# Patient Record
Sex: Female | Born: 1980 | Race: Black or African American | Hispanic: No | Marital: Single | Smoking: Never smoker
Health system: Southern US, Community
[De-identification: ages and names within clinical notes are randomized; demographics above are authoritative.]

## PROBLEM LIST (undated history)

## (undated) DIAGNOSIS — J302 Other seasonal allergic rhinitis: Secondary | ICD-10-CM

## (undated) DIAGNOSIS — F419 Anxiety disorder, unspecified: Secondary | ICD-10-CM

## (undated) DIAGNOSIS — J45909 Unspecified asthma, uncomplicated: Secondary | ICD-10-CM

## (undated) DIAGNOSIS — K219 Gastro-esophageal reflux disease without esophagitis: Secondary | ICD-10-CM

## (undated) DIAGNOSIS — I10 Essential (primary) hypertension: Secondary | ICD-10-CM

## (undated) DIAGNOSIS — S3992XA Unspecified injury of lower back, initial encounter: Secondary | ICD-10-CM

## (undated) DIAGNOSIS — D649 Anemia, unspecified: Secondary | ICD-10-CM

## (undated) DIAGNOSIS — S8990XA Unspecified injury of unspecified lower leg, initial encounter: Secondary | ICD-10-CM

## (undated) DIAGNOSIS — R7303 Prediabetes: Secondary | ICD-10-CM

## (undated) DIAGNOSIS — R519 Headache, unspecified: Secondary | ICD-10-CM

## (undated) DIAGNOSIS — E66813 Obesity, class 3: Secondary | ICD-10-CM

## (undated) DIAGNOSIS — J189 Pneumonia, unspecified organism: Secondary | ICD-10-CM

## (undated) DIAGNOSIS — S46009A Unspecified injury of muscle(s) and tendon(s) of the rotator cuff of unspecified shoulder, initial encounter: Secondary | ICD-10-CM

## (undated) DIAGNOSIS — F32A Depression, unspecified: Secondary | ICD-10-CM

## (undated) HISTORY — DX: Obesity, class 3: E66.813

## (undated) HISTORY — DX: Other seasonal allergic rhinitis: J30.2

## (undated) HISTORY — DX: Unspecified injury of lower back, initial encounter: S39.92XA

## (undated) HISTORY — PX: TONSILLECTOMY: SUR1361

## (undated) HISTORY — DX: Morbid (severe) obesity due to excess calories: E66.01

## (undated) HISTORY — DX: Unspecified injury of muscle(s) and tendon(s) of the rotator cuff of unspecified shoulder, initial encounter: S46.009A

## (undated) HISTORY — DX: Essential (primary) hypertension: I10

## (undated) HISTORY — DX: Unspecified asthma, uncomplicated: J45.909

## (undated) HISTORY — DX: Unspecified injury of unspecified lower leg, initial encounter: S89.90XA

---

## 2002-01-06 ENCOUNTER — Encounter: Admission: RE | Admit: 2002-01-06 | Discharge: 2002-01-06 | Payer: Self-pay | Admitting: Emergency Medicine

## 2002-01-06 ENCOUNTER — Encounter: Payer: Self-pay | Admitting: Emergency Medicine

## 2004-01-06 ENCOUNTER — Encounter: Admission: RE | Admit: 2004-01-06 | Discharge: 2004-01-06 | Payer: Self-pay | Admitting: Emergency Medicine

## 2005-04-16 IMAGING — CR DG CHEST 2V
2 series · 2 of 2 positions shown · non-contrast
Comparison: none

CLINICAL DATA: Cough, wheezing, asthma.
 TWO VIEW CHEST ? 01/06/04 
 The heart is borderline in size with left ventricular configuration.  There are no infiltrative or edematous changes.  There are mildly accentuated bronchial markings. 
 IMPRESSION
 Borderline cardiac size.  Mildly accentuated bronchial markings.  No acute infiltrate.

[view not recorded (1 of 2)]
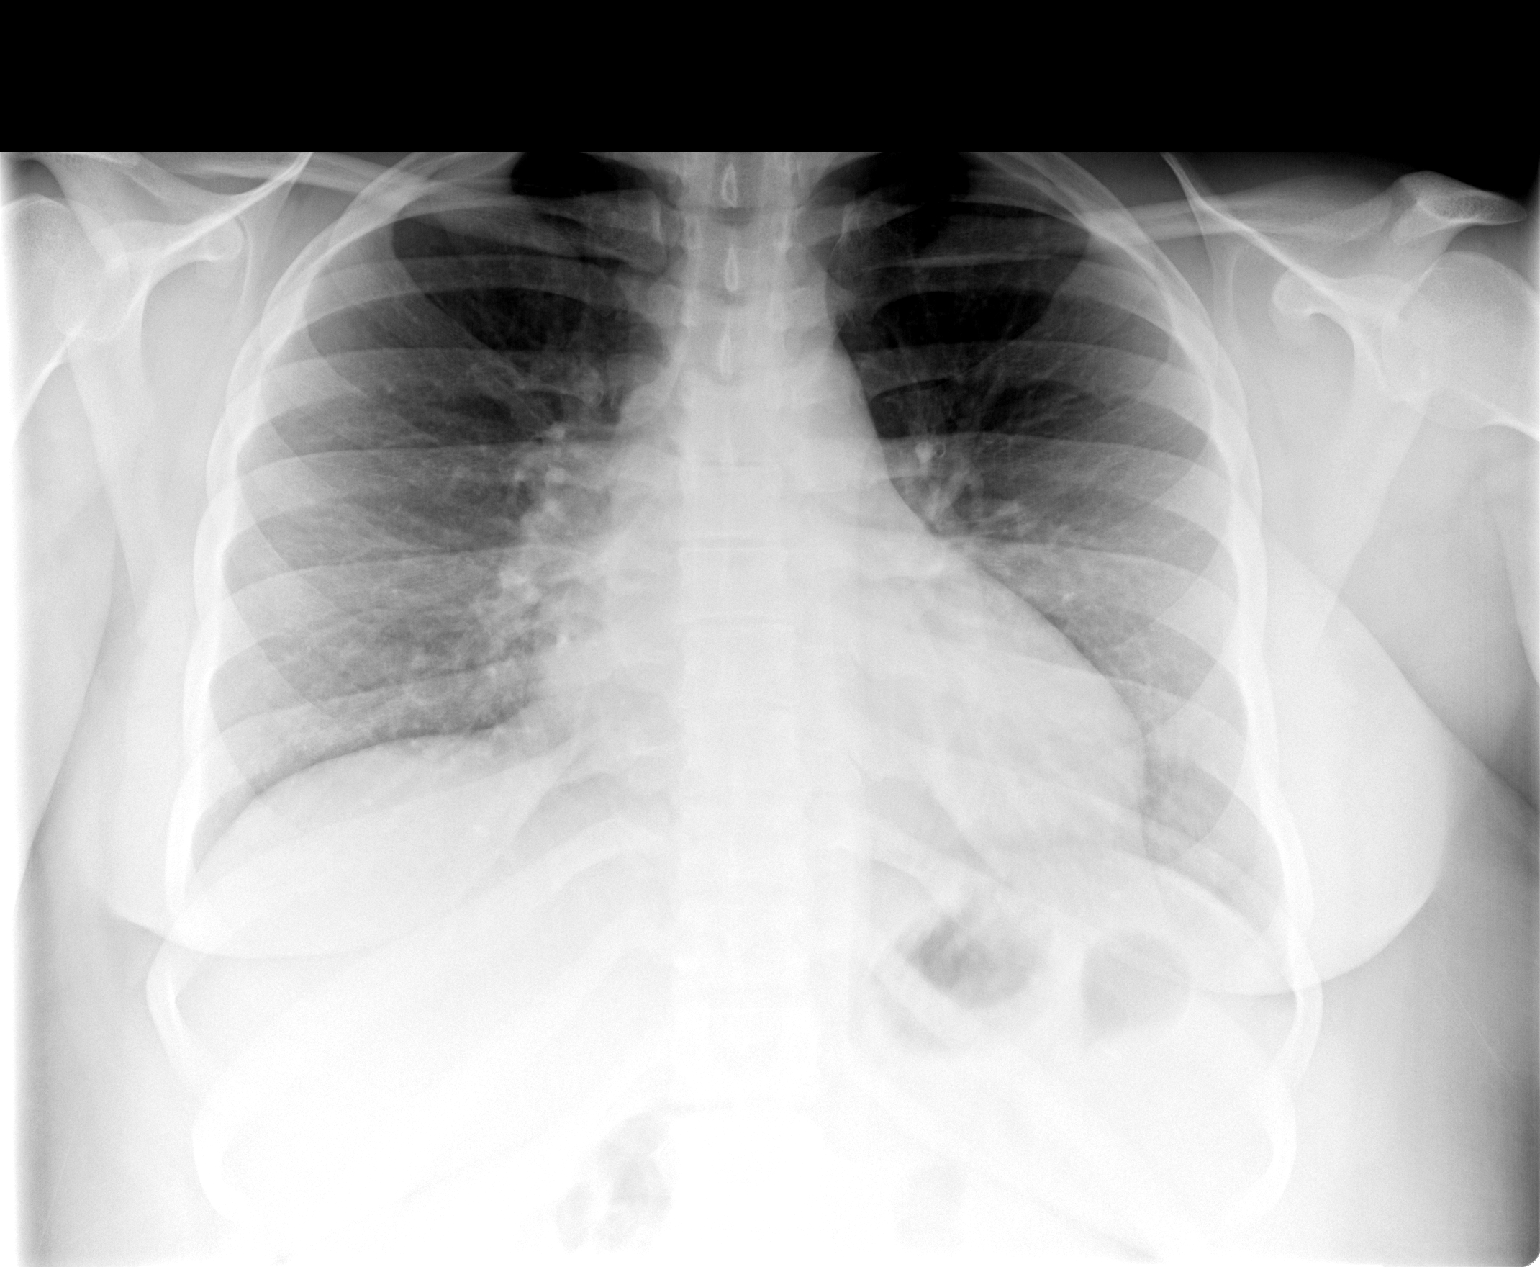

[view not recorded (2 of 2)]
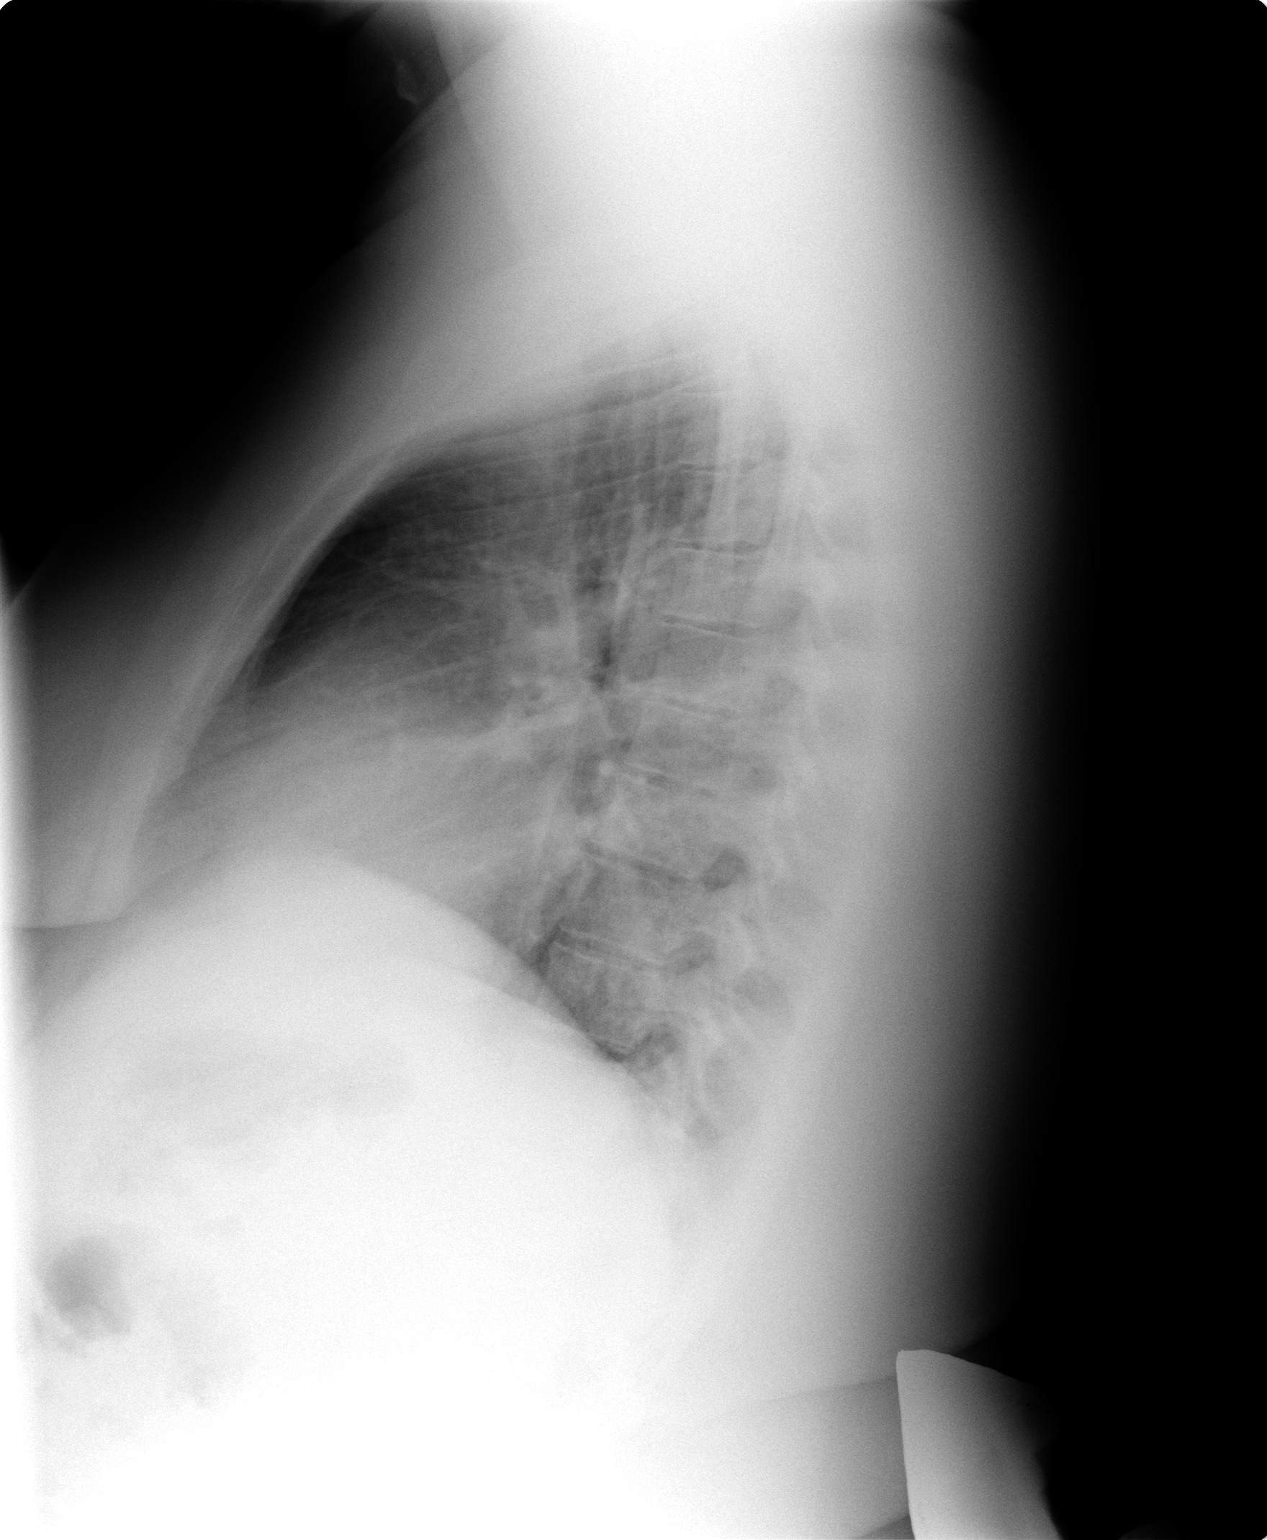

[2 of 2 positions shown; findings below may reference images not displayed]

## 2008-01-27 ENCOUNTER — Emergency Department (HOSPITAL_COMMUNITY): Admission: EM | Admit: 2008-01-27 | Discharge: 2008-01-27 | Payer: Self-pay | Admitting: Family Medicine

## 2014-09-04 NOTE — L&D Delivery Note (Cosign Needed)
Delivery Note Progressed to + 3 station with laboring down.  Pushed one time to delivery.  At 11:39 PM a viable and healthy female was delivered via Vaginal, Spontaneous Delivery (Presentation: ; Occiput Anterior).  APGAR: 9, 9; weight  .   Placenta status: Intact, Spontaneous.  Cord:  with the following complications: None.   Anesthesia: Epidural  Episiotomy: None Lacerations:  2nd degree midline perineal Suture Repair: 3.0 Monocryl Est. Blood Loss (mL):    Mom to postpartum.  Baby to Couplet care / Skin to Skin.  [facility]ILL[facility],Amy Fults 01/20/2015, 12:02 AM

## 2014-10-23 ENCOUNTER — Ambulatory Visit: Payer: Medicaid Other

## 2014-10-29 ENCOUNTER — Encounter: Payer: Self-pay | Admitting: Internal Medicine

## 2014-10-29 ENCOUNTER — Encounter: Payer: Self-pay | Admitting: Obstetrics & Gynecology

## 2014-10-29 ENCOUNTER — Ambulatory Visit (INDEPENDENT_AMBULATORY_CARE_PROVIDER_SITE_OTHER): Payer: Self-pay | Admitting: Internal Medicine

## 2014-10-29 VITALS — BP 157/102 | HR 100 | Temp 98.1°F | Ht 62.0 in | Wt 271.3 lb

## 2014-10-29 DIAGNOSIS — N926 Irregular menstruation, unspecified: Secondary | ICD-10-CM

## 2014-10-29 DIAGNOSIS — J302 Other seasonal allergic rhinitis: Secondary | ICD-10-CM

## 2014-10-29 DIAGNOSIS — I10 Essential (primary) hypertension: Secondary | ICD-10-CM | POA: Insufficient documentation

## 2014-10-29 DIAGNOSIS — Z0000 Encounter for general adult medical examination without abnormal findings: Secondary | ICD-10-CM

## 2014-10-29 DIAGNOSIS — R1032 Left lower quadrant pain: Secondary | ICD-10-CM | POA: Insufficient documentation

## 2014-10-29 DIAGNOSIS — J4520 Mild intermittent asthma, uncomplicated: Secondary | ICD-10-CM

## 2014-10-29 DIAGNOSIS — J45909 Unspecified asthma, uncomplicated: Secondary | ICD-10-CM | POA: Insufficient documentation

## 2014-10-29 LAB — GLUCOSE, CAPILLARY: Glucose-Capillary: 107 mg/dL — ABNORMAL HIGH (ref 70–99)

## 2014-10-29 LAB — POCT GLYCOSYLATED HEMOGLOBIN (HGB A1C): Hemoglobin A1C: 5.5

## 2014-10-29 MED ORDER — ALBUTEROL SULFATE (2.5 MG/3ML) 0.083% IN NEBU: 2.5000 mg | INHALATION_SOLUTION | Freq: Four times a day (QID) | RESPIRATORY_TRACT | Status: DC | PRN

## 2014-10-29 MED ORDER — HYDROCHLOROTHIAZIDE 25 MG PO TABS: 25.0000 mg | ORAL_TABLET | Freq: Every day | ORAL | Status: DC

## 2014-10-29 MED ORDER — ALBUTEROL SULFATE HFA 108 (90 BASE) MCG/ACT IN AERS: 2.0000 | INHALATION_SPRAY | Freq: Four times a day (QID) | RESPIRATORY_TRACT | Status: DC | PRN

## 2014-10-29 MED ORDER — CETIRIZINE HCL 10 MG PO TABS: 10.0000 mg | ORAL_TABLET | Freq: Every day | ORAL | Status: DC

## 2014-10-29 NOTE — Patient Instructions (Addendum)
-  It was a pleasure meeting you! -Start taking Zyrtec every day for your allergies.  -Use the albuterol inhaler or nebulizer as needed. Carry the inhaler with you at all times.  -Stop taking Afrin.This will raise your blood pressure.  -Start taking Hydrochlorothiazide 25mg  daily for your blood pressure.  -You have been referred to Gynecology. They will call you with the appointment date and time.  -Follow up with us in 1 month.

## 2014-10-30 ENCOUNTER — Encounter: Payer: Self-pay | Admitting: Internal Medicine

## 2014-10-30 DIAGNOSIS — Z0000 Encounter for general adult medical examination without abnormal findings: Secondary | ICD-10-CM | POA: Insufficient documentation

## 2014-10-30 DIAGNOSIS — N926 Irregular menstruation, unspecified: Secondary | ICD-10-CM | POA: Insufficient documentation

## 2014-10-30 NOTE — Progress Notes (Signed)
   Subjective:    Patient ID: Amy SimsJuliet L Ferguson, female    DOB: 01-09-1981, 34 y.o.   MRN: [id number]  HPI Ms. Amy Ferguson is a 34 yr old woman with PMH of asthma, morbid obesity, seasonal allergies, HTN, presenting for routine visit. She had not been to a doctor in over one year and had been out of her BP medication (HCTZ) for 1 yr. She reports a recent cold with Afrin use daily and Coricidin for the past 3 weeks. She used to take Zyrtec but stopped taking this medication. She still has a nebulizer machine at home and uses albuterol neb as needed, with no recent use.  She complains of hirsutism for years with metromenorrhagia, menstrual cycles light some months than heavy other months. Her LMP was on 10/01/14.  She has had LLQ pain that comes and goes, not associated with her periods or with movements.  She has been constipated off and on but reports adequate water and fiber intake (fruits and vegetables).   Review of Systems  Constitutional: Negative for fever, chills, diaphoresis, activity change, appetite change, fatigue and unexpected weight change.  HENT: Positive for congestion. Negative for ear pain, nosebleeds, postnasal drip, rhinorrhea, sinus pressure, sore throat and voice change.   Eyes: Negative for pain.  Respiratory: Negative for cough, shortness of breath and wheezing.   Cardiovascular: Negative for chest pain, palpitations and leg swelling.  Gastrointestinal: Positive for abdominal pain and constipation. Negative for nausea, vomiting, diarrhea and blood in stool.  Endocrine: Negative for cold intolerance, heat intolerance, polydipsia and polyuria.  Genitourinary: Positive for menstrual problem. Negative for dysuria, frequency, vaginal discharge and difficulty urinating.  Musculoskeletal: Negative for back pain and arthralgias.  Skin: Negative for color change and pallor.       Hirsutism   Neurological: Negative for dizziness, light-headedness and headaches.    Psychiatric/Behavioral: Negative for agitation.       Objective:   Physical Exam  Constitutional: She is oriented to person, place, and time. She appears well-developed and well-nourished. No distress.  Morbidly obese with central obesity  HENT:  Head: Normocephalic.  Mouth/Throat: Oropharynx is clear and moist.  Pale nasal turbinates bilaterally   Eyes: Conjunctivae are normal. No scleral icterus.  Neck: No thyromegaly present.  Acanthosis nigricans with skin tags on posterior and lateral neck   Cardiovascular: Normal rate and regular rhythm.   Pulmonary/Chest: Effort normal and breath sounds normal. No respiratory distress. She has no wheezes. She has no rales.  Abdominal: Soft. Bowel sounds are normal. She exhibits no distension and no mass. There is tenderness. There is no rebound and no guarding.  Obese. Has tenderness to deep palpation of low LLQ with no rebound tenderness.   Musculoskeletal: She exhibits no edema or tenderness.  Neurological: She is alert and oriented to person, place, and time. Coordination normal.  Skin: Skin is warm and dry. She is not diaphoretic. No erythema.  Hirsutism with thick dark hair around chin  Psychiatric: She has a normal mood and affect.  Nursing note and vitals reviewed.         Assessment & Plan:

## 2014-10-30 NOTE — Assessment & Plan Note (Signed)
Could be 2/2 PCOS if she has this Dx.  Will discuss referral to dietitian during her next visit.

## 2014-10-30 NOTE — Assessment & Plan Note (Signed)
No recent flare up.  -Rx albuterol inh, encouraged pt to carry this at all times -Rx albuterol neb for home use

## 2014-10-30 NOTE — Assessment & Plan Note (Signed)
BP Readings from Last 3 Encounters:  10/29/14 157/102    No results found for: NA, K, CREATININE  Assessment: Blood pressure control:  Not controlled Progress toward BP goal:   not at goal Comments: She used to be on HCTZ 25mg  one yr ago per her report. States tat she was on lisinopril long time ago.   Plan: Medications:  start HCTZ 25mg  daily Educational resources provided:   Self management tools provided:   Other plans: Follow up in 1 month. Will obtain BMET at that time. Pt signed release of info so we can obtain records from previous PCP ([place]

## 2014-10-30 NOTE — Assessment & Plan Note (Signed)
Pt advised to avoid Afrin.  Resume Zyrtec daily.  Saline nasal spray PRN

## 2014-10-30 NOTE — Assessment & Plan Note (Addendum)
She had HIV screening within 2 yrs. Wears protection.  Not sexually active recently.  Had abnormal PAP in the past but most recent reported as normal. Will have PAP smear at GYN Decline flu vaccine Had Tdap within 10 yrs-->will obtain records from PCP Screened for DM2--HgA1c 5.5%, CBG 107, she does not have diabetes

## 2014-10-30 NOTE — Assessment & Plan Note (Signed)
She has irregular menstrual cycles, central obesity with weight loss difficulty, hirsutism. She may have PCOS.  -referred to GYN--she is also due for PAP smear.

## 2014-10-30 NOTE — Assessment & Plan Note (Signed)
Unclear etiology. The pain comes and goes, localized to her low LLQ. Constipation v ovarian cyst possible. No melena or hematochezia reported.  Will continue monitoring for now.  Pt encouraged to increase water intake to improve constipation. Continue intake of fresh fruits and vegetables.

## 2014-11-03 NOTE — Progress Notes (Signed)
Internal Medicine Clinic Attending  Case discussed with Dr. Kennerly soon after the resident saw the patient.  We reviewed the resident's history and exam and pertinent patient test results.  I agree with the assessment, diagnosis, and plan of care documented in the resident's note.  

## 2014-11-26 ENCOUNTER — Ambulatory Visit: Payer: Medicaid Other | Admitting: Internal Medicine

## 2014-12-10 ENCOUNTER — Ambulatory Visit (INDEPENDENT_AMBULATORY_CARE_PROVIDER_SITE_OTHER): Payer: Self-pay | Admitting: Obstetrics & Gynecology

## 2014-12-10 ENCOUNTER — Encounter: Payer: Self-pay | Admitting: Obstetrics & Gynecology

## 2014-12-10 VITALS — BP 166/95 | HR 103 | Temp 98.1°F | Wt 265.0 lb

## 2014-12-10 DIAGNOSIS — R102 Pelvic and perineal pain: Secondary | ICD-10-CM

## 2014-12-10 DIAGNOSIS — N915 Oligomenorrhea, unspecified: Secondary | ICD-10-CM

## 2014-12-10 LAB — TSH: TSH: 2.649 u[IU]/mL (ref 0.350–4.500)

## 2014-12-10 NOTE — Progress Notes (Signed)
US scheduled for May 9th @ [phone]

## 2014-12-10 NOTE — Progress Notes (Signed)
   Subjective:    Patient ID: Amy Ferguson, female    DOB: 1980-09-28, 34 y.o.   MRN: [id number]  HPI 33yo SAA G0 is here today with the complaint of daily pelvic pain since Sept. She has been taking IBU 800mg  with only a mild relief. She has not been sexually active sice 8/15. She also has oligomenorhea and hirsuitism. Her last period was  1/16. She has to pluck hair from her face. Also has hair on her check   Review of Systems She will get insurance in 3 weeks as of 01-03-15. Her periods were normal in middle and high school, menarche at [age]. But she put on weight in college and her periods became rare starting back then. When she was on the OCP, her periods were regular    Objective:   Physical Exam Morbidly obese WNBFNAD Breathing and ambulating normally       Assessment & Plan:  HTN- She will follow up with [facility] next week I will get her an appt with a nutritionist Probable PCOS- rec weight loss, no OCPs now due to her HTN, check TSH Pelvic pain- schedule u/s

## 2015-01-11 ENCOUNTER — Other Ambulatory Visit: Payer: Self-pay | Admitting: Family Medicine

## 2015-01-11 ENCOUNTER — Ambulatory Visit (HOSPITAL_COMMUNITY)
Admission: RE | Admit: 2015-01-11 | Discharge: 2015-01-11 | Disposition: A | Discharge status: Home / Self Care | Payer: No Typology Code available for payment source | Source: Ambulatory Visit | Attending: Obstetrics & Gynecology | Admitting: Obstetrics & Gynecology

## 2015-01-11 ENCOUNTER — Telehealth: Payer: Self-pay

## 2015-01-11 DIAGNOSIS — Z3490 Encounter for supervision of normal pregnancy, unspecified, unspecified trimester: Secondary | ICD-10-CM | POA: Diagnosis present

## 2015-01-11 DIAGNOSIS — R102 Pelvic and perineal pain: Secondary | ICD-10-CM

## 2015-01-11 NOTE — Telephone Encounter (Signed)
[facility] states patient scheduled for pelvic U/S-- patient pregnant-- needs order for U/S in system. U/S order placed.

## 2015-01-18 ENCOUNTER — Ambulatory Visit (INDEPENDENT_AMBULATORY_CARE_PROVIDER_SITE_OTHER): Payer: PRIVATE HEALTH INSURANCE | Admitting: Obstetrics & Gynecology

## 2015-01-18 ENCOUNTER — Inpatient Hospital Stay (HOSPITAL_COMMUNITY)
Admission: AD | Admit: 2015-01-18 | Discharge: 2015-01-22 | DRG: 774 | Disposition: A | Discharge status: Home / Self Care | Payer: Medicaid Other | Source: Ambulatory Visit | Attending: Family Medicine | Admitting: Family Medicine

## 2015-01-18 ENCOUNTER — Encounter: Payer: Self-pay | Admitting: Obstetrics & Gynecology

## 2015-01-18 ENCOUNTER — Encounter (HOSPITAL_COMMUNITY): Payer: Self-pay | Admitting: *Deleted

## 2015-01-18 DIAGNOSIS — O1413 Severe pre-eclampsia, third trimester: Principal | ICD-10-CM | POA: Diagnosis present

## 2015-01-18 DIAGNOSIS — O1092 Unspecified pre-existing hypertension complicating childbirth: Secondary | ICD-10-CM | POA: Diagnosis present

## 2015-01-18 DIAGNOSIS — A599 Trichomoniasis, unspecified: Secondary | ICD-10-CM | POA: Diagnosis present

## 2015-01-18 DIAGNOSIS — O99214 Obesity complicating childbirth: Secondary | ICD-10-CM | POA: Diagnosis present

## 2015-01-18 DIAGNOSIS — Z3A40 40 weeks gestation of pregnancy: Secondary | ICD-10-CM | POA: Diagnosis present

## 2015-01-18 DIAGNOSIS — O480 Post-term pregnancy: Secondary | ICD-10-CM | POA: Diagnosis present

## 2015-01-18 DIAGNOSIS — J45909 Unspecified asthma, uncomplicated: Secondary | ICD-10-CM | POA: Diagnosis present

## 2015-01-18 DIAGNOSIS — Z91040 Latex allergy status: Secondary | ICD-10-CM | POA: Diagnosis not present

## 2015-01-18 DIAGNOSIS — O10912 Unspecified pre-existing hypertension complicating pregnancy, second trimester: Secondary | ICD-10-CM

## 2015-01-18 DIAGNOSIS — O9832 Other infections with a predominantly sexual mode of transmission complicating childbirth: Secondary | ICD-10-CM | POA: Diagnosis present

## 2015-01-18 DIAGNOSIS — Z79899 Other long term (current) drug therapy: Secondary | ICD-10-CM

## 2015-01-18 DIAGNOSIS — Z6841 Body Mass Index (BMI) 40.0 and over, adult: Secondary | ICD-10-CM

## 2015-01-18 DIAGNOSIS — O10919 Unspecified pre-existing hypertension complicating pregnancy, unspecified trimester: Secondary | ICD-10-CM

## 2015-01-18 DIAGNOSIS — O0933 Supervision of pregnancy with insufficient antenatal care, third trimester: Secondary | ICD-10-CM | POA: Diagnosis not present

## 2015-01-18 DIAGNOSIS — O99213 Obesity complicating pregnancy, third trimester: Secondary | ICD-10-CM

## 2015-01-18 DIAGNOSIS — O9952 Diseases of the respiratory system complicating childbirth: Secondary | ICD-10-CM | POA: Diagnosis present

## 2015-01-18 DIAGNOSIS — O99824 Streptococcus B carrier state complicating childbirth: Secondary | ICD-10-CM | POA: Diagnosis not present

## 2015-01-18 LAB — POCT URINALYSIS DIP (DEVICE)
Glucose, UA: NEGATIVE mg/dL
Nitrite: NEGATIVE
PH: 6.5 (ref 5.0–8.0)
PROTEIN: 100 mg/dL — AB
UROBILINOGEN UA: 0.2 mg/dL (ref 0.0–1.0)

## 2015-01-18 LAB — URINE MICROSCOPIC-ADD ON

## 2015-01-18 LAB — COMPREHENSIVE METABOLIC PANEL
ALK PHOS: 126 U/L (ref 38–126)
ALT: 10 U/L — AB (ref 14–54)
AST: 21 U/L (ref 15–41)
Albumin: 3.2 g/dL — ABNORMAL LOW (ref 3.5–5.0)
Anion gap: 9 (ref 5–15)
BUN: 6 mg/dL (ref 6–20)
CALCIUM: 9.1 mg/dL (ref 8.9–10.3)
CO2: 22 mmol/L (ref 22–32)
CREATININE: 0.53 mg/dL (ref 0.44–1.00)
Chloride: 104 mmol/L (ref 101–111)
GFR calc Af Amer: >60 mL/min (ref >60)
GFR calc non Af Amer: >60 mL/min (ref >60)
Glucose, Bld: 68 mg/dL (ref 65–99)
POTASSIUM: 4.6 mmol/L (ref 3.5–5.1)
Sodium: 135 mmol/L (ref 135–145)
TOTAL PROTEIN: 6.7 g/dL (ref 6.5–8.1)
Total Bilirubin: 0.3 mg/dL (ref 0.3–1.2)

## 2015-01-18 LAB — RAPID HIV SCREEN (HIV 1/2 AB+AG)
HIV 1/2 ANTIBODIES: NONREACTIVE
HIV-1 P24 Antigen - HIV24: NONREACTIVE

## 2015-01-18 LAB — GROUP B STREP BY PCR: GROUP B STREP BY PCR: POSITIVE — AB

## 2015-01-18 LAB — CBC
HEMATOCRIT: 31.7 % — AB (ref 36.0–46.0)
Hemoglobin: 10.1 g/dL — ABNORMAL LOW (ref 12.0–15.0)
MCH: 24.9 pg — AB (ref 26.0–34.0)
MCHC: 31.9 g/dL (ref 30.0–36.0)
MCV: 78.3 fL (ref 78.0–100.0)
Platelets: 331 10*3/uL (ref 150–400)
RBC: 4.05 MIL/uL (ref 3.87–5.11)
RDW: 16.5 % — ABNORMAL HIGH (ref 11.5–15.5)
WBC: 5.4 10*3/uL (ref 4.0–10.5)

## 2015-01-18 LAB — URINALYSIS, ROUTINE W REFLEX MICROSCOPIC
Glucose, UA: NEGATIVE mg/dL
Ketones, ur: 15 mg/dL — AB
NITRITE: NEGATIVE
PH: 6.5 (ref 5.0–8.0)
Protein, ur: 30 mg/dL — AB
UROBILINOGEN UA: 0.2 mg/dL (ref 0.0–1.0)

## 2015-01-18 LAB — OB RESULTS CONSOLE GBS: GBS: POSITIVE

## 2015-01-18 LAB — OB RESULTS CONSOLE GC/CHLAMYDIA: Gonorrhea: NEGATIVE

## 2015-01-18 LAB — PROTEIN / CREATININE RATIO, URINE
Creatinine, Urine: 442 mg/dL
PROTEIN CREATININE RATIO: 0.08 mg/mg{creat} (ref 0.00–0.15)
Total Protein, Urine: 34 mg/dL

## 2015-01-18 LAB — OB RESULTS CONSOLE HIV ANTIBODY (ROUTINE TESTING): HIV: NONREACTIVE

## 2015-01-18 MED ORDER — OXYCODONE-ACETAMINOPHEN 5-325 MG PO TABS: 1.0000 | ORAL_TABLET | ORAL | Status: DC | PRN

## 2015-01-18 MED ORDER — METRONIDAZOLE 500 MG PO TABS
2000.0000 mg | ORAL_TABLET | Freq: Once | ORAL | Status: AC
  Administered 2015-01-18: 2000 mg via ORAL
  Filled 2015-01-18: qty 4

## 2015-01-18 MED ORDER — LACTATED RINGERS IV SOLN
500.0000 mL | INTRAVENOUS | Status: DC | PRN
  Administered 2015-01-19: 500 mL via INTRAVENOUS

## 2015-01-18 MED ORDER — HYDRALAZINE HCL 20 MG/ML IJ SOLN
5.0000 mg | INTRAMUSCULAR | Status: AC | PRN
Start: 2015-01-18 — End: 2015-01-18
  Administered 2015-01-18: 10 mg via INTRAVENOUS
  Administered 2015-01-18: 5 mg via INTRAVENOUS
  Filled 2015-01-18 (×2): qty 1

## 2015-01-18 MED ORDER — PENICILLIN G POTASSIUM 5000000 UNITS IJ SOLR
2.5000 10*6.[IU] | INTRAVENOUS | Status: DC
  Administered 2015-01-19 (×3): 2.5 10*6.[IU] via INTRAVENOUS
  Filled 2015-01-18 (×8): qty 2.5

## 2015-01-18 MED ORDER — OXYTOCIN 40 UNITS IN LACTATED RINGERS INFUSION - SIMPLE MED: 62.5000 mL/h | INTRAVENOUS | Status: DC

## 2015-01-18 MED ORDER — CITRIC ACID-SODIUM CITRATE 334-500 MG/5ML PO SOLN
30.0000 mL | ORAL | Status: DC | PRN
  Administered 2015-01-19: 30 mL via ORAL
  Filled 2015-01-18: qty 15
  Filled 2015-01-18: qty 30

## 2015-01-18 MED ORDER — OXYCODONE-ACETAMINOPHEN 5-325 MG PO TABS: 2.0000 | ORAL_TABLET | ORAL | Status: DC | PRN

## 2015-01-18 MED ORDER — OXYTOCIN 40 UNITS IN LACTATED RINGERS INFUSION - SIMPLE MED
1.0000 m[IU]/min | INTRAVENOUS | Status: DC
  Administered 2015-01-18: 2 m[IU]/min via INTRAVENOUS
  Filled 2015-01-18: qty 1000

## 2015-01-18 MED ORDER — LACTATED RINGERS IV SOLN
INTRAVENOUS | Status: DC
  Administered 2015-01-18 – 2015-01-19 (×6): via INTRAVENOUS

## 2015-01-18 MED ORDER — ONDANSETRON HCL 4 MG/2ML IJ SOLN: 4.0000 mg | Freq: Four times a day (QID) | INTRAMUSCULAR | Status: DC | PRN

## 2015-01-18 MED ORDER — PENICILLIN G POTASSIUM 5000000 UNITS IJ SOLR
5.0000 10*6.[IU] | Freq: Once | INTRAVENOUS | Status: AC
  Administered 2015-01-19: 5 10*6.[IU] via INTRAVENOUS
  Filled 2015-01-18: qty 5

## 2015-01-18 MED ORDER — PENICILLIN G POTASSIUM 5000000 UNITS IJ SOLR
2.5000 10*6.[IU] | INTRAVENOUS | Status: DC
  Filled 2015-01-18 (×2): qty 2.5

## 2015-01-18 MED ORDER — DEXTROSE 5 % IV SOLN
5.0000 10*6.[IU] | Freq: Once | INTRAVENOUS | Status: DC
  Filled 2015-01-18: qty 5

## 2015-01-18 MED ORDER — MAGNESIUM SULFATE 4 GM/100ML IV SOLN
4.0000 g | Freq: Once | INTRAVENOUS | Status: AC
  Filled 2015-01-18: qty 100

## 2015-01-18 MED ORDER — MISOPROSTOL 25 MCG QUARTER TABLET
25.0000 ug | ORAL_TABLET | ORAL | Status: DC | PRN
  Administered 2015-01-19 (×2): 25 ug via VAGINAL
  Filled 2015-01-18 (×2): qty 0.25

## 2015-01-18 MED ORDER — ACETAMINOPHEN 325 MG PO TABS: 650.0000 mg | ORAL_TABLET | ORAL | Status: DC | PRN

## 2015-01-18 MED ORDER — LABETALOL HCL 200 MG PO TABS
200.0000 mg | ORAL_TABLET | Freq: Three times a day (TID) | ORAL | Status: DC
  Administered 2015-01-18 – 2015-01-19 (×3): 200 mg via ORAL
  Filled 2015-01-18 (×2): qty 2
  Filled 2015-01-18: qty 1
  Filled 2015-01-18 (×3): qty 2

## 2015-01-18 MED ORDER — MAGNESIUM SULFATE 50 % IJ SOLN
2.0000 g/h | INTRAVENOUS | Status: DC
  Administered 2015-01-18: 4 g/h via INTRAVENOUS
  Administered 2015-01-19: 2 g/h via INTRAVENOUS
  Filled 2015-01-18 (×2): qty 80

## 2015-01-18 MED ORDER — FENTANYL CITRATE (PF) 100 MCG/2ML IJ SOLN
50.0000 ug | INTRAMUSCULAR | Status: DC | PRN
  Administered 2015-01-18 – 2015-01-19 (×5): 100 ug via INTRAVENOUS
  Filled 2015-01-18 (×5): qty 2

## 2015-01-18 MED ORDER — LABETALOL HCL 5 MG/ML IV SOLN
20.0000 mg | INTRAVENOUS | Status: DC | PRN
  Administered 2015-01-18: 20 mg via INTRAVENOUS
  Filled 2015-01-18: qty 4

## 2015-01-18 MED ORDER — TERBUTALINE SULFATE 1 MG/ML IJ SOLN: 0.2500 mg | Freq: Once | INTRAMUSCULAR | Status: AC | PRN

## 2015-01-18 MED ORDER — LIDOCAINE HCL (PF) 1 % IJ SOLN
30.0000 mL | INTRAMUSCULAR | Status: DC | PRN
  Filled 2015-01-18: qty 30

## 2015-01-18 MED ORDER — FLEET ENEMA 7-19 GM/118ML RE ENEM: 1.0000 | ENEMA | RECTAL | Status: DC | PRN

## 2015-01-18 MED ORDER — OXYTOCIN BOLUS FROM INFUSION
500.0000 mL | INTRAVENOUS | Status: DC
  Administered 2015-01-19: 500 mL via INTRAVENOUS

## 2015-01-18 NOTE — Progress Notes (Addendum)
Patient ID: Amy Ferguson, female   DOB: 04-04-1981, 34 y.o.   MRN: [id number] Doing well, comfortable  Filed Vitals:   01/18/15 2202 01/18/15 2240 01/18/15 2302 01/18/15 2332  BP: 117/63 154/92 152/91 128/77  Pulse: 80 87 87 81  Temp: 98.4 F (36.9 C)     TempSrc:      Resp: 19 20 20 20   Height:      Weight:      SpO2:       FHR reassuring, nonreactive right now, no decels  UCs every 3-5 min  Dilation: Closed Effacement (%): Thick Cervical Position: Posterior Station: -3 Presentation: Vertex Exam by:: roberts  I think we should turn off the Pitocin and insert Cytotec, due to unfavorable cervix.

## 2015-01-18 NOTE — Progress Notes (Signed)
GBS positive result telephoned to Michaela CornerJ Phelps DO

## 2015-01-18 NOTE — Progress Notes (Signed)
Labor Progress Note  S: Doing well. Starting to feel uncomfortable with contractions and wants IV pain medications. Also states she will most likely want epidural. Denies HA, scotomata, RUQ pain.   O:  BP 129/69 mmHg  Pulse 82  Temp(Src) 97.9 F (36.6 C) (Oral)  Resp 16  Ht 5\' 3"  (1.6 m)  Wt 258 lb (117.028 kg)  BMI 45.71 kg/m2  SpO2 98%  LMP 10/01/2014 Cervix: deferred, foley bulb in place Toco: Ctx q 3-4 mins  FHT: 120 bpm, +accels, mod variability Heart tones appropriate +DTRs   Intake/Output Summary (Last 24 hours) at 01/18/15 1824 Last data filed at 01/18/15 1800  Gross per 24 hour  Intake 710.76 ml  Output    700 ml  Net  10.76 ml   A&P: 34 y.o. G1P0 4375w2d in early labor. #FHT reassuring #Pain: order placed for IV pain medication; patient may get epidural upon request #Decels: None #augmentaion: Continue pitocin and foley bulb. #Severe Preeclampsia - stable, no signs of mag toxicity, BPs <135/70, continue mag  Expectant mangement  Amy AdaJazma Leanthony Rhett, DO 01/18/2015, 6:15 PM PGY-1, [facility]

## 2015-01-18 NOTE — Progress Notes (Signed)
Here for first visit. Just found out is pregnant 01/11/15. Given 28 wk education pamphlets. States bp med was changed to hctz/ lisinopril recently but stopped taking it when she found out she was pregnant- so none for 1 week. States has been having headaches so she thought her blood pressure was high.
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
## 2015-01-18 NOTE — Progress Notes (Signed)
Elevated BP term pregnancy scheduled for NOB, will send to MAU for admission

## 2015-01-18 NOTE — Patient Instructions (Signed)
Labor Induction  Labor induction is when steps are taken to cause a pregnant woman to begin the labor process. Most women go into labor on their own between 37 weeks and 42 weeks of the pregnancy. When this does not happen or when there is a medical need, methods may be used to induce labor. Labor induction causes a pregnant woman's uterus to contract. It also causes the cervix to soften (ripen), open (dilate), and thin out (efface). Usually, labor is not induced before 39 weeks of the pregnancy unless there is a problem with the baby or mother.  Before inducing labor, your health care provider will consider a number of factors, including the following:  The medical condition of you and the baby.   How many weeks along you are.   The status of the baby's lung maturity.   The condition of the cervix.   The position of the baby.  WHAT ARE THE REASONS FOR LABOR INDUCTION? Labor may be induced for the following reasons:  The health of the baby or mother is at risk.   The pregnancy is overdue by 1 week or more.   The water breaks but labor does not start on its own.   The mother has a health condition or serious illness, such as high blood pressure, infection, placental abruption, or diabetes.  The amniotic fluid amounts are low around the baby.   The baby is distressed.  Convenience or wanting the baby to be born on a certain date is not a reason for inducing labor. WHAT METHODS ARE USED FOR LABOR INDUCTION? Several methods of labor induction may be used, such as:   Prostaglandin medicine. This medicine causes the cervix to dilate and ripen. The medicine will also start contractions. It can be taken by mouth or by inserting a suppository into the vagina.   Inserting a thin tube (catheter) with a balloon on the end into the vagina to dilate the cervix. Once inserted, the balloon is expanded with water, which causes the cervix to open.   Stripping the membranes. Your health  care provider separates amniotic sac tissue from the cervix, causing the cervix to be stretched and causing the release of a hormone called progesterone. This may cause the uterus to contract. It is often done during an office visit. You will be sent home to wait for the contractions to begin. You will then come in for an induction.   Breaking the water. Your health care provider makes a hole in the amniotic sac using a small instrument. Once the amniotic sac breaks, contractions should begin. This may still take hours to see an effect.   Medicine to trigger or strengthen contractions. This medicine is given through an IV access tube inserted into a vein in your arm.  All of the methods of induction, besides stripping the membranes, will be done in the hospital. Induction is done in the hospital so that you and the baby can be carefully monitored.  HOW LONG DOES IT TAKE FOR LABOR TO BE INDUCED? Some inductions can take up to 2-3 days. Depending on the cervix, it usually takes less time. It takes longer when you are induced early in the pregnancy or if this is your first pregnancy. If a mother is still pregnant and the induction has been going on for 2-3 days, either the mother will be sent home or a cesarean delivery will be needed. WHAT ARE THE RISKS ASSOCIATED WITH LABOR INDUCTION? Some of the risks of induction   include:   Changes in fetal heart rate, such as too high, too low, or erratic.   Fetal distress.   Chance of infection for the mother and baby.   Increased chance of having a cesarean delivery.   Breaking off (abruption) of the placenta from the uterus (rare).   Uterine rupture (very rare).  When induction is needed for medical reasons, the benefits of induction may outweigh the risks. WHAT ARE SOME REASONS FOR NOT INDUCING LABOR? Labor induction should not be done if:   It is shown that your baby does not tolerate labor.   You have had previous surgeries on your  uterus, such as a myomectomy or the removal of fibroids.   Your placenta lies very low in the uterus and blocks the opening of the cervix (placenta previa).   Your baby is not in a head-down position.   The umbilical cord drops down into the birth canal in front of the baby. This could cut off the baby's blood and oxygen supply.   You have had a previous cesarean delivery.   There are unusual circumstances, such as the baby being extremely premature.  Document Released: 01/10/2007 Document Revised: 04/23/2013 Document Reviewed: 03/20/2013 Exit[employer] Patient Information 2015 [employer]. This information is not intended to replace advice given to you by your health care provider. Make sure you discuss any questions you have with your health care provider.  

## 2015-01-18 NOTE — MAU Note (Signed)
Pt was sent over from office for elevated b/p 150 /113. C/o mild headache.

## 2015-01-19 ENCOUNTER — Inpatient Hospital Stay (HOSPITAL_COMMUNITY): Payer: Medicaid Other | Admitting: Anesthesiology

## 2015-01-19 ENCOUNTER — Encounter (HOSPITAL_COMMUNITY): Payer: Self-pay | Admitting: Anesthesiology

## 2015-01-19 LAB — HIV ANTIBODY (ROUTINE TESTING W REFLEX): HIV Screen 4th Generation wRfx: NONREACTIVE

## 2015-01-19 LAB — CBC
HCT: 29.7 % — ABNORMAL LOW (ref 36.0–46.0)
HEMOGLOBIN: 9.6 g/dL — AB (ref 12.0–15.0)
MCH: 24.9 pg — AB (ref 26.0–34.0)
MCHC: 32.3 g/dL (ref 30.0–36.0)
MCV: 76.9 fL — ABNORMAL LOW (ref 78.0–100.0)
Platelets: 292 10*3/uL (ref 150–400)
RBC: 3.86 MIL/uL — ABNORMAL LOW (ref 3.87–5.11)
RDW: 16.7 % — ABNORMAL HIGH (ref 11.5–15.5)
WBC: 8.3 10*3/uL (ref 4.0–10.5)

## 2015-01-19 LAB — RUBELLA SCREEN: Rubella: 2.61 index (ref >0.99)

## 2015-01-19 LAB — GC/CHLAMYDIA PROBE AMP (~~LOC~~) NOT AT ARMC
Chlamydia: NEGATIVE
Neisseria Gonorrhea: NEGATIVE

## 2015-01-19 LAB — RPR: RPR: NONREACTIVE

## 2015-01-19 LAB — HEPATITIS B SURFACE ANTIBODY, QUANTITATIVE: Hepatitis B-Post: 267.7 m[IU]/mL (ref >9.9)

## 2015-01-19 LAB — OB RESULTS CONSOLE HEPATITIS B SURFACE ANTIGEN: HEP B S AG: NEGATIVE

## 2015-01-19 MED ORDER — FENTANYL 2.5 MCG/ML BUPIVACAINE 1/10 % EPIDURAL INFUSION (WH - ANES)
14.0000 mL/h | INTRAMUSCULAR | Status: DC | PRN
  Administered 2015-01-19 (×2): 14 mL/h via EPIDURAL
  Filled 2015-01-19 (×2): qty 125

## 2015-01-19 MED ORDER — EPHEDRINE 5 MG/ML INJ
10.0000 mg | INTRAVENOUS | Status: DC | PRN
  Filled 2015-01-19: qty 2

## 2015-01-19 MED ORDER — PHENYLEPHRINE 40 MCG/ML (10ML) SYRINGE FOR IV PUSH (FOR BLOOD PRESSURE SUPPORT)
80.0000 ug | PREFILLED_SYRINGE | INTRAVENOUS | Status: DC | PRN
  Filled 2015-01-19: qty 2
  Filled 2015-01-19: qty 20

## 2015-01-19 MED ORDER — DIPHENHYDRAMINE HCL 50 MG/ML IJ SOLN: 12.5000 mg | INTRAMUSCULAR | Status: DC | PRN

## 2015-01-19 MED ORDER — OXYTOCIN 40 UNITS IN LACTATED RINGERS INFUSION - SIMPLE MED
1.0000 m[IU]/min | INTRAVENOUS | Status: DC
  Administered 2015-01-19: 2 m[IU]/min via INTRAVENOUS
  Filled 2015-01-19: qty 1000

## 2015-01-19 MED ORDER — LIDOCAINE HCL (PF) 1 % IJ SOLN
INTRAMUSCULAR | Status: DC | PRN
  Administered 2015-01-19 (×2): 8 mL

## 2015-01-19 MED ORDER — MISOPROSTOL 50MCG HALF TABLET
50.0000 ug | ORAL_TABLET | ORAL | Status: DC | PRN
  Administered 2015-01-19 (×2): 50 ug via ORAL
  Filled 2015-01-19 (×3): qty 1

## 2015-01-19 NOTE — Anesthesia Procedure Notes (Signed)
Epidural Patient location during procedure: OB Start time: 01/19/2015 1:46 PM End time: 01/19/2015 1:50 PM  Staffing Anesthesiologist: Leilani AbleHATCHETT, Camika Marsico Performed by: anesthesiologist   Preanesthetic Checklist Completed: patient identified, surgical consent, pre-op evaluation, timeout performed, IV checked, risks and benefits discussed and monitors and equipment checked  Epidural Patient position: sitting Prep: site prepped and draped and DuraPrep Patient monitoring: continuous pulse ox and blood pressure Approach: midline Location: L3-L4 Injection technique: LOR air  Needle:  Needle type: Tuohy  Needle gauge: 17 G Needle length: 9 cm and 9 Needle insertion depth: 6 cm Catheter type: closed end flexible Catheter size: 19 Gauge Catheter at skin depth: 11 cm Test dose: negative and Other  Assessment Sensory level: T9 Events: blood not aspirated, injection not painful, no injection resistance, negative IV test and no paresthesia  Additional Notes Reason for block:procedure for pain

## 2015-01-19 NOTE — Progress Notes (Signed)
Patient ID: Rolley SimsJuliet L Ferguson, female   DOB: Jan 28, 1981, 34 y.o.   MRN: [id number] Doing well  Filed Vitals:   01/19/15 1928 01/19/15 1931 01/19/15 2002 01/19/15 2031  BP:  136/87 134/75   Pulse:  86 88   Temp:    98 F (36.7 C)  TempSrc:    Oral  Resp: 20   20  Height:      Weight:      SpO2:       FHR currently nonreactive UCs every 3-6 min  Continue Pitocin

## 2015-01-19 NOTE — Progress Notes (Signed)
Labor Progress Note  Rolley SimsJuliet L Filippi is a 34 y.o. G1P0 at 5220w3d  admitted for induction of labor due to preeclampsia.  S: Doing well. Getting epidural upon entering room. States her contractions are stronger and closer together. Denies PIH symptoms (HA, scotomata, RUQ pain).  O:  BP 133/83 mmHg  Pulse 82  Temp(Src) 98.2 F (36.8 C) (Oral)  Resp 16  Ht 5\' 3"  (1.6 m)  Wt 258 lb (117.028 kg)  BMI 45.71 kg/m2  SpO2 100%  LMP 10/01/2014  Total I/O In: 1835 [P.O.:360; I.V.:1125; IV Piggyback:350] Out: 975 [Urine:975]  FHT:  FHR: 130 bpm, variability: minimal ,  accelerations:  Abscent,  decelerations:  Absent UC:   None, hard to pick up SVE:   Dilation: 2.5 Effacement (%): Thick Station: -3 Exam by:: h stone rnc SROM: clear  Dilation: 2.5 Effacement (%): Thick Cervical Position: Posterior Station: -3 Presentation: Vertex Exam by:: h stone rnc  Labs: Lab Results  Component Value Date   WBC 8.3 01/19/2015   HGB 9.6* 01/19/2015   HCT 29.7* 01/19/2015   MCV 76.9* 01/19/2015   PLT 292 01/19/2015    Assessment / Plan: 34 y.o. G1P0 6720w3d in early labor Induction of labor due to preeclampsia,  progressing well  Labor: Progressing normally, continue Cytotec and FB Preeclampsia: Continue Mag, no signs of toxicity Fetal Wellbeing:  Category II Pain Control:  Epidural Anticipated MOD:  NSVD  Expectant management   [name], DO 01/19/2015, 2:51 PM PGY-1, [facility]

## 2015-01-19 NOTE — Anesthesia Preprocedure Evaluation (Signed)
Anesthesia Evaluation  Patient identified by MRN, date of birth, ID band Patient awake    Reviewed: Allergy & Precautions, H&P , NPO status , Patient's Chart, lab work & pertinent test results  Airway Mallampati: II  TM Distance: >3 FB Neck ROM: full    Dental no notable dental hx.    Pulmonary neg pulmonary ROS,    Pulmonary exam normal       Cardiovascular hypertension, Pt. on medications Normal cardiovascular exam    Neuro/Psych negative neurological ROS  negative psych ROS   GI/Hepatic negative GI ROS, Neg liver ROS,   Endo/Other  Morbid obesity  Renal/GU negative Renal ROS     Musculoskeletal   Abdominal (+) + obese,   Peds  Hematology negative hematology ROS (+)   Anesthesia Other Findings   Reproductive/Obstetrics (+) Pregnancy                             Anesthesia Physical Anesthesia Plan  ASA: III  Anesthesia Plan: Epidural   Post-op Pain Management:    Induction:   Airway Management Planned:   Additional Equipment:   Intra-op Plan:   Post-operative Plan:   Informed Consent: I have reviewed the patients History and Physical, chart, labs and discussed the procedure including the risks, benefits and alternatives for the proposed anesthesia with the patient or authorized representative who has indicated his/her understanding and acceptance.     Plan Discussed with:   Anesthesia Plan Comments:         Anesthesia Quick Evaluation

## 2015-01-19 NOTE — Progress Notes (Signed)
Patient ID: Amy SimsJuliet L Ferguson, female   DOB: August 11, 1981, 34 y.o.   MRN: [id number] Due for another cytotec, but contracting too much  Filed Vitals:   01/19/15 0102 01/19/15 0202 01/19/15 0301 01/19/15 0402  BP: 113/59 125/72 135/88 113/72  Pulse: 83 78 85 82  Temp:      TempSrc:      Resp: 20 18 17 18   Height:      Weight:      SpO2:        FHR reassuring UCs every 3 min  Dilation: 1.5 Effacement (%): Thick Cervical Position: Posterior Station: -3 Presentation: Vertex Exam by:: Honeycutt, RN  Will monitor for decrease in UCs then insert another Cytotec

## 2015-01-19 NOTE — Progress Notes (Signed)
Patient ID: Rolley SimsJuliet L Ferguson, female   DOB: 1981/05/13, 34 y.o.   MRN: [id number] Feeling some pressure. But overall comfortable  Filed Vitals:   01/19/15 1928 01/19/15 1931 01/19/15 2002 01/19/15 2031  BP:  136/87 134/75   Pulse:  86 88   Temp:    98 F (36.7 C)  TempSrc:    Oral  Resp: 20   20  Height:      Weight:      SpO2:       FHR stable with some variable decels FSE applied  Dilation: 10 Effacement (%): 100 Cervical Position: Anterior Station: 0 Presentation: Vertex Exam by:: Mayford KnifeWilliams CNM  Will labor down for a while then start pushing

## 2015-01-20 ENCOUNTER — Encounter (HOSPITAL_COMMUNITY): Payer: Self-pay | Admitting: *Deleted

## 2015-01-20 DIAGNOSIS — O1413 Severe pre-eclampsia, third trimester: Secondary | ICD-10-CM

## 2015-01-20 DIAGNOSIS — O9832 Other infections with a predominantly sexual mode of transmission complicating childbirth: Secondary | ICD-10-CM

## 2015-01-20 DIAGNOSIS — O99824 Streptococcus B carrier state complicating childbirth: Secondary | ICD-10-CM

## 2015-01-20 DIAGNOSIS — O99214 Obesity complicating childbirth: Secondary | ICD-10-CM

## 2015-01-20 DIAGNOSIS — A5901 Trichomonal vulvovaginitis: Secondary | ICD-10-CM

## 2015-01-20 DIAGNOSIS — Z3A40 40 weeks gestation of pregnancy: Secondary | ICD-10-CM

## 2015-01-20 LAB — CBC
HEMATOCRIT: 25 % — AB (ref 36.0–46.0)
Hemoglobin: 8 g/dL — ABNORMAL LOW (ref 12.0–15.0)
MCH: 24.8 pg — ABNORMAL LOW (ref 26.0–34.0)
MCHC: 32 g/dL (ref 30.0–36.0)
MCV: 77.6 fL — AB (ref 78.0–100.0)
PLATELETS: 290 10*3/uL (ref 150–400)
RBC: 3.22 MIL/uL — AB (ref 3.87–5.11)
RDW: 16.8 % — ABNORMAL HIGH (ref 11.5–15.5)
WBC: 13.3 10*3/uL — ABNORMAL HIGH (ref 4.0–10.5)

## 2015-01-20 LAB — MRSA PCR SCREENING: MRSA BY PCR: NEGATIVE

## 2015-01-20 MED ORDER — PRENATAL MULTIVITAMIN CH
1.0000 | ORAL_TABLET | Freq: Every day | ORAL | Status: DC
  Administered 2015-01-20 – 2015-01-22 (×3): 1 via ORAL
  Filled 2015-01-20 (×3): qty 1

## 2015-01-20 MED ORDER — ZOLPIDEM TARTRATE 5 MG PO TABS: 5.0000 mg | ORAL_TABLET | Freq: Every evening | ORAL | Status: DC | PRN

## 2015-01-20 MED ORDER — OXYCODONE-ACETAMINOPHEN 5-325 MG PO TABS: 1.0000 | ORAL_TABLET | ORAL | Status: DC | PRN

## 2015-01-20 MED ORDER — ACETAMINOPHEN 325 MG PO TABS: 650.0000 mg | ORAL_TABLET | ORAL | Status: DC | PRN

## 2015-01-20 MED ORDER — ONDANSETRON HCL 4 MG PO TABS: 4.0000 mg | ORAL_TABLET | ORAL | Status: DC | PRN

## 2015-01-20 MED ORDER — SENNOSIDES-DOCUSATE SODIUM 8.6-50 MG PO TABS
2.0000 | ORAL_TABLET | ORAL | Status: DC
  Administered 2015-01-20: 2 via ORAL
  Filled 2015-01-20: qty 2

## 2015-01-20 MED ORDER — ONDANSETRON HCL 4 MG/2ML IJ SOLN: 4.0000 mg | INTRAMUSCULAR | Status: DC | PRN

## 2015-01-20 MED ORDER — IBUPROFEN 600 MG PO TABS
600.0000 mg | ORAL_TABLET | Freq: Four times a day (QID) | ORAL | Status: DC
  Administered 2015-01-20 (×3): 600 mg via ORAL
  Filled 2015-01-20 (×5): qty 1

## 2015-01-20 MED ORDER — PNEUMOCOCCAL VAC POLYVALENT 25 MCG/0.5ML IJ INJ
0.5000 mL | INJECTION | INTRAMUSCULAR | Status: DC
  Filled 2015-01-20: qty 0.5

## 2015-01-20 MED ORDER — SIMETHICONE 80 MG PO CHEW: 80.0000 mg | CHEWABLE_TABLET | ORAL | Status: DC | PRN

## 2015-01-20 MED ORDER — MAGNESIUM SULFATE 50 % IJ SOLN
2.0000 g/h | INTRAVENOUS | Status: AC
  Administered 2015-01-20: 2 g/h via INTRAVENOUS
  Filled 2015-01-20: qty 80

## 2015-01-20 MED ORDER — DIBUCAINE 1 % RE OINT: 1.0000 "application " | TOPICAL_OINTMENT | RECTAL | Status: DC | PRN

## 2015-01-20 MED ORDER — LANOLIN HYDROUS EX OINT: TOPICAL_OINTMENT | CUTANEOUS | Status: DC | PRN

## 2015-01-20 MED ORDER — TETANUS-DIPHTH-ACELL PERTUSSIS 5-2.5-18.5 LF-MCG/0.5 IM SUSP
0.5000 mL | Freq: Once | INTRAMUSCULAR | Status: DC
  Filled 2015-01-20: qty 0.5

## 2015-01-20 MED ORDER — WITCH HAZEL-GLYCERIN EX PADS: 1.0000 "application " | MEDICATED_PAD | CUTANEOUS | Status: DC | PRN

## 2015-01-20 MED ORDER — OXYCODONE-ACETAMINOPHEN 5-325 MG PO TABS
2.0000 | ORAL_TABLET | ORAL | Status: DC | PRN
Start: 2015-01-20 — End: 2015-01-22

## 2015-01-20 MED ORDER — DIPHENHYDRAMINE HCL 25 MG PO CAPS: 25.0000 mg | ORAL_CAPSULE | Freq: Four times a day (QID) | ORAL | Status: DC | PRN

## 2015-01-20 MED ORDER — BENZOCAINE-MENTHOL 20-0.5 % EX AERO
1.0000 "application " | INHALATION_SPRAY | CUTANEOUS | Status: DC | PRN
  Administered 2015-01-20: 1 via TOPICAL
  Filled 2015-01-20: qty 56

## 2015-01-20 MED ORDER — RHO D IMMUNE GLOBULIN 1500 UNIT/2ML IJ SOSY
300.0000 ug | PREFILLED_SYRINGE | Freq: Once | INTRAMUSCULAR | Status: AC
  Administered 2015-01-21: 300 ug via INTRAVENOUS
  Filled 2015-01-20: qty 2

## 2015-01-20 MED ORDER — LACTATED RINGERS IV SOLN
INTRAVENOUS | Status: DC
  Administered 2015-01-20 (×2): via INTRAVENOUS

## 2015-01-20 NOTE — Progress Notes (Signed)
Post Partum Day 1 Subjective: no complaints, up ad lib, voiding and tolerating PO  Objective: Blood pressure 120/69, pulse 86, temperature 98.7 F (37.1 C), temperature source Oral, resp. rate 18, height 5\' 3"  (1.6 m), weight 254 lb 1.6 oz (115.259 kg), last menstrual period 10/01/2014, SpO2 97 %, unknown if currently breastfeeding.  Physical Exam:  General: alert, cooperative and no distress Lochia: appropriate Uterine Fundus: firm DVT Evaluation: No evidence of DVT seen on physical exam. Negative Homan's sign.   Recent Labs  01/18/15 1300 01/19/15 1303  HGB 10.1* 9.6*  HCT 31.7* 29.7*    Assessment/Plan: On Mag for preeclampsia x 24 hours.   No other complications currently BP controlled.   LOS: 2 days   Jaedon Siler JEHIEL 01/20/2015, 9:31 AM

## 2015-01-20 NOTE — Lactation Note (Signed)
This note was copied from the chart of Amy Ferguson Mrozek. Lactation Consultation Note Initial visit made with mom in AICU.  Breastfeeding consultation services and support information given and reviewed with patient. This is her first baby.  Baby is now 10 hours old and has been to the breast 4 times.  Mom states he is latching easily.  Instructed on feeding cues and techniques to obtain deep latch.  Encouraged mom to hand express prior to latching baby.  Baby just had bath and he is sleeping skin to skin on mom's chest.  Instructed to call for assist/concerns prn.  Patient Name: Amy Ferguson Madani ZOXWR'UToday's Date: 01/20/2015 Reason for consult: Initial assessment   Maternal Data    Feeding Feeding Type: Breast Milk Length of feed: 0 min  LATCH Score/Interventions                      Lactation Tools Discussed/Used     Consult Status Consult Status: Follow-up Date: 01/21/15 Follow-up type: In-patient    Huston FoleyMOULDEN, Tyion Boylen S 01/20/2015, 9:43 AM

## 2015-01-20 NOTE — Anesthesia Postprocedure Evaluation (Signed)
  Anesthesia Post-op Note  Patient: Amy SimsJuliet L Ferguson  Procedure(s) Performed: * No procedures listed *  Patient Location: PACU and A-ICU  Anesthesia Type:Epidural  Level of Consciousness: awake, alert  and oriented  Airway and Oxygen Therapy: Patient Spontanous Breathing  Post-op Pain: none  Post-op Assessment: Post-op Vital signs reviewed and Patient's Cardiovascular Status Stable  Post-op Vital Signs: Reviewed and stable  Last Vitals:  Filed Vitals:   01/20/15 0800  BP: 114/78  Pulse: 94  Temp:   Resp:     Complications: No apparent anesthesia complications

## 2015-01-21 ENCOUNTER — Encounter (HOSPITAL_COMMUNITY): Payer: Self-pay

## 2015-01-21 MED ORDER — LABETALOL HCL 200 MG PO TABS
200.0000 mg | ORAL_TABLET | Freq: Two times a day (BID) | ORAL | Status: DC
  Administered 2015-01-21 (×2): 200 mg via ORAL
  Filled 2015-01-21 (×5): qty 1

## 2015-01-21 NOTE — Progress Notes (Signed)
Post Partum Day 2  Subjective: no complaints, up ad lib, voiding and tolerating PO  Objective: Blood pressure 144/90, pulse 80, temperature 99 F (37.2 C), temperature source Oral, resp. rate 18, height 5\' 3"  (1.6 m), weight 260 lb 8 oz (118.162 kg), last menstrual period 10/01/2014, SpO2 97 %, unknown if currently breastfeeding.   Filed Vitals:   01/21/15 0306 01/21/15 0500 01/21/15 0534 01/21/15 0625  BP: 147/88 167/104 156/100 144/90  Pulse: 86 80    Temp:  99 F (37.2 C)    TempSrc:  Oral    Resp: 18 18 18    Height:      Weight:  260 lb 8 oz (118.162 kg)    SpO2: 99% 97%      Physical Exam:  General: alert, cooperative and no distress Lochia: appropriate Uterine Fundus: firm Incision:  DVT Evaluation: No evidence of DVT seen on physical exam.   Recent Labs  01/19/15 1303 01/20/15 1720  HGB 9.6* 8.0*  HCT 29.7* 25.0*    Assessment/Plan: Plan for discharge tomorrow and Breastfeeding  Begin labetalol 200 BID since she is breasfeeding Transfer to floor   LOS: 3 days   EURE,LUTHER H 01/21/2015, 7:25 AM

## 2015-01-21 NOTE — Lactation Note (Signed)
This note was copied from the chart of Amy Gardenia PhlegmJuliet Lender. Lactation Consultation Note  Patient Name: Amy Ferguson ZHYQM'VToday's Date: 01/21/2015 Reason for consult: Initial assessment   With this m om of a term baby, now 38 hours post partum. Mom has been breast feeding in cradle hold, and reports feeling some pinching. I assisted mom with cross cradle hold, and baby latched deeply, with strong suckles and lots of visible swallows. I did teaching on breastfeeding from the Baby and Me book. Mom very receptive to teaching. She did not realize she was pregnant, and this is her first baby  -  Mom doing well, lactation services reviewed with mom, support group encouraged, as well as feelings after birth being available. Mom knows to call  [facility] lactation as needed.    Maternal Data Formula Feeding for Exclusion: Yes Reason for exclusion: Admission to Intensive Care Unit (ICU) post-partum (mom in AICU) Has patient been taught Hand Expression?: Yes Does the patient have breastfeeding experience prior to this delivery?: No  Feeding Feeding Type: Breast Fed Length of feed: 12 min  LATCH Score/Interventions Latch: Grasps breast easily, tongue down, lips flanged, rhythmical sucking. Intervention(s): Teach feeding cues;Waking techniques Intervention(s): Adjust position;Assist with latch  Audible Swallowing: Spontaneous and intermittent (visible swallows)  Type of Nipple: Everted at rest and after stimulation  Comfort (Breast/Nipple): Soft / non-tender     Hold (Positioning): Assistance needed to correctly position infant at breast and maintain latch. Intervention(s): Breastfeeding basics reviewed;Support Pillows;Position options;Skin to skin  LATCH Score: 9  Lactation Tools Discussed/Used WIC Program: No (mom going to apply once home)   Consult Status Consult Status: Follow-up Date: 01/22/15 Follow-up type: In-patient    Alfred LevinsLee, Rohn Fritsch Anne 01/21/2015, 4:13 PM

## 2015-01-22 LAB — RH IG WORKUP (INCLUDES ABO/RH)
ABO/RH(D): A NEG
Fetal Screen: NEGATIVE
Gestational Age(Wks): 40.3
Unit division: 0

## 2015-01-22 LAB — TYPE AND SCREEN
ABO/RH(D): A NEG
ANTIBODY SCREEN: POSITIVE
DAT, IgG: NEGATIVE
Unit division: 0
Unit division: 0

## 2015-01-22 MED ORDER — LISINOPRIL 20 MG PO TABS: 20.0000 mg | ORAL_TABLET | Freq: Every day | ORAL | Status: DC

## 2015-01-22 MED ORDER — SENNOSIDES-DOCUSATE SODIUM 8.6-50 MG PO TABS: 2.0000 | ORAL_TABLET | ORAL | Status: DC | PRN

## 2015-01-22 MED ORDER — LISINOPRIL 20 MG PO TABS
20.0000 mg | ORAL_TABLET | Freq: Once | ORAL | Status: AC
  Administered 2015-01-22: 20 mg via ORAL
  Filled 2015-01-22: qty 1

## 2015-01-22 NOTE — Discharge Summary (Signed)
Obstetric Discharge Summary Reason for Admission: induction of labor and 2/2 severe preeclampsia Prenatal Procedures: Preeclampsia Intrapartum Procedures: spontaneous vaginal delivery Postpartum Procedures: Magnesium Complications-Operative and Postpartum: 2nd degree perineal laceration  Delivery Summary: Progressed to + 3 station with laboring down. Pushed one time to delivery. At 11:39 PM a viable and healthy female was delivered via Vaginal, Spontaneous Delivery (Presentation: ; Occiput Anterior). APGAR: 9, 9; weight .  Placenta status: Intact, Spontaneous. Cord: with the following complications: None.   Anesthesia: Epidural  Episiotomy: None Lacerations: 2nd degree midline perineal Suture Repair: 3.0 Monocryl   Hospital Course:  Active Problems:   Chronic hypertension in pregnancy   Amy Ferguson is a 34 y.o. G1P1001 s/p NSVD.  Patient was admitted for IOL 2/2 sever preeclampsia.  She has postpartum course that was uncomplicated. The pt feels ready to go home and  will be discharged with outpatient follow-up.   Today: No acute events overnight.  Pt denies problems with ambulating, voiding or po intake.  She denies nausea or vomiting.  Pain is well controlled.  She has had flatus. She has had bowel movement.  Lochia Small.  Plan for birth control is  abstinence.  Method of Feeding: Breast  Physical Exam:  BP 158/98 mmHg  Pulse 83  Temp(Src) 98.2 F (36.8 C) (Oral)  Resp 18  Ht 5\' 3"  (1.6 m)  Wt 260 lb 8 oz (118.162 kg)  BMI 46.16 kg/m2  SpO2 99%  LMP 10/01/2014  Breastfeeding? Unknown Patient Vitals for the past 24 hrs:  BP Temp Temp src Pulse Resp  01/22/15 1349 (!) 158/98 mmHg - - 83 -  01/22/15 1228 (!) 174/102 mmHg - - - -  01/22/15 0715 (!) 155/88 mmHg - - 88 -  01/22/15 0515 (!) 145/88 mmHg - - - -  01/22/15 0512 (!) 146/88 mmHg - - 92 -  01/22/15 0111 138/88 mmHg - - 88 -  01/21/15 2115 134/80 mmHg 98.2 F (36.8 C) Oral 84 18  01/21/15 1733  134/86 mmHg 99 F (37.2 C) Oral 95 18    General: alert, cooperative and no distress Lochia: appropriate Uterine Fundus: firm DVT Evaluation: No evidence of DVT seen on physical exam.  H/H: Lab Results  Component Value Date/Time   HGB 8.0* 01/20/2015 05:20 PM   HCT 25.0* 01/20/2015 05:20 PM    Discharge Diagnoses: Term Pregnancy-delivered and Preelampsia  Discharge Information: Date: 01/22/2015 Activity: pelvic rest Diet: routine  Medications: PNV, Ibuprofen, Colace and Resume home medications Breast feeding:  Yes Condition: stable Instructions: refer to handout Discharge to: home   Discharge Instructions    Increase activity slowly    Complete by:  As directed             Medication List    STOP taking these medications        hydrochlorothiazide 25 MG tablet  Commonly known as:  HYDRODIURIL     lisinopril-hydrochlorothiazide 20-25 MG per tablet  Commonly known as:  PRINZIDE,ZESTORETIC      TAKE these medications        albuterol 108 (90 BASE) MCG/ACT inhaler  Commonly known as:  PROVENTIL HFA;VENTOLIN HFA  Inhale 2 puffs into the lungs every 6 (six) hours as needed for wheezing or shortness of breath.     albuterol (2.5 MG/3ML) 0.083% nebulizer solution  Commonly known as:  PROVENTIL  Take 3 mLs (2.5 mg total) by nebulization every 6 (six) hours as needed for wheezing or shortness of breath.  cetirizine 10 MG tablet  Commonly known as:  ZYRTEC  Take 1 tablet (10 mg total) by mouth daily.     ibuprofen 800 MG tablet  Commonly known as:  ADVIL,MOTRIN  Take 800 mg by mouth every 8 (eight) hours as needed.     lisinopril 20 MG tablet  Commonly known as:  ZESTRIL  Take 1 tablet (20 mg total) by mouth daily.     senna-docusate 8.6-50 MG per tablet  Commonly known as:  Senokot-S  Take 2 tablets by mouth as needed for mild constipation.     WOMENS MULTIVITAMIN PLUS PO  Take by mouth.        Amy AdaJazma Phelps, DO 01/22/2015, 9:26 AM PGY-1, [facility]  I was present for the exam and agree with above. Per consult w/ Dr. Adrian Ferguson, [facility] will put pt back on Lisinopril, but not HCTZ due to breastfeeding. BP check in 1 week.   [place] Devory Ferguson, CNM 01/22/2015 4:10 PM

## 2015-01-22 NOTE — Lactation Note (Signed)
This note was copied from the chart of Amy Ferguson Deahl. Lactation Consultation Note  Patient Name: Amy Ferguson Jonsson ZOXWR'UToday's Date: 01/22/2015 Reason for consult: Follow-up assessment;Infant weight loss Baby at 8% weight loss, voids 3 in past 24 hours, 10 in life, stools 5 in 24 hours, 8 in life. Baby now 5860 hours old. Mom reports hearing swallows with feedings this am. She has started to pump, receiving small amount of colostrum. Mom does report her right breasts feels "heavier". Baby latched well at this visit, assisted Mom with depth, demonstrated how to bring bottom lip down. Swallows motions observed with feeding. With hand expression after BF, received approx 1.5 ml of breast milk and demonstrated spoon feeding this back to baby. Mom plans to get DEBP for home use. Mom has hand pump.  LC advised baby should be at the breast 8-12 times in 24 hours and with feeding ques. Keep baby actively nursing for 15-30 minutes, both breasts each feeding. Post pump after feedings for 15 minutes except at night to encourage milk production and give baby back any amount of EBM she receives. Peds f/u Monday. Discussed with Mom that if baby nursing as discussed above, adequate voids/stools, breasts are filling between feedings and softening after nursing, baby is satisfied, she should only supplement with EBM if needed. If baby is not satisfied at the breasts and breasts are not changing, inadequate voids/stools then supplement after feedings EBM/formula as needed per guidelines. Mom has history of chronic hypertension, NPNC, EBM 835 ml with SVB. Breasts are slightly wide spaced, tubular. Mom reports understanding. Advised of OP services and support group. Encouraged to call for questions/concerns.   Maternal Data    Feeding Feeding Type: Breast Milk Length of feed: 35 min  LATCH Score/Interventions Latch: Grasps breast easily, tongue down, lips flanged, rhythmical sucking. Intervention(s): Adjust  position;Assist with latch;Breast massage;Breast compression  Audible Swallowing: A few with stimulation  Type of Nipple: Everted at rest and after stimulation  Comfort (Breast/Nipple): Soft / non-tender     Hold (Positioning): Assistance needed to correctly position infant at breast and maintain latch. Intervention(s): Breastfeeding basics reviewed;Support Pillows;Position options;Skin to skin  LATCH Score: 8  Lactation Tools Discussed/Used Tools: Pump Breast pump type: Double-Electric Breast Pump   Consult Status Consult Status: Complete Date: 01/22/15 Follow-up type: In-patient    Alfred LevinsGranger, Dorna Mallet Ann 01/22/2015, 11:53 AM

## 2015-01-22 NOTE — Clinical Social Work Maternal (Signed)
CLINICAL SOCIAL WORK MATERNAL/CHILD NOTE  Patient Details  Name: BOB EASTWOOD MRN: [id number] Date of Birth: [birth year]  Date:  01/22/2015  Clinical Social Worker Initiating Note:  Colleen E. Brigitte Pulse, [place] Date/ Time Initiated:  01/22/15/1130     Child's Name:  Michael Boston   Legal Guardian:  Mother   Need for Interpreter:  None   Date of Referral:  01/22/15     Reason for Referral:  Late or No Prenatal Care  (Support, [facility], Feels frustrated with care.)   Referral Source:  [facility]   Address:  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, quantity at a location Manufacturing engineer)   Education:  Engineer, maintenance Resources:  Multimedia programmer (MOB has an appointment to apply for Medicaid on 02/12/15)   Other Resources:      Cultural/Religious Considerations Which May Impact Care:  None stated  Strengths:  Ability to meet basic needs , Home prepared for child    Risk Factors/Current Problems:  Other (Comment) (Adjustment to motherhood as she states she learned of the pregnancy one week ago-she had been seeking medical treatment for pain for months with no one diagnosing her as pregnant)   Cognitive State:  Alert , Goal Oriented , Insightful , Linear Thinking    Mood/Affect:  Calm , Relaxed    CSW Assessment: CSW met with MOB to complete assessment for [facility] and feelings of frustration related to care.  MOB was very open with CSW and appeared appreciative of the opportunity to share her story and discuss her feelings.  MOB reports being in pain for the past 4 months and seeking treatment at [facility].  She states she applied for their financial assistance program because she was not working at the time.  She states she was evaluated and the  doctor thought she might have PCOS.  She was scheduled for an appointment at [facility] more than a month later, but states that when she came, she could not have an ultrasound because the financial program did not carry over to [facility] and that she would have to reapply.  She states she had started a new job by this time and would have private insurance by May 1st, so she waited to schedule the ultrasound.  She states she was in complete shock when she came on May 9th, 2016 for the ultrasound and found out that she was pregnant and at term.  She had her first PNV on May 16th and was told she would be admitted for delivery.  She was calm in sharing this, but CSW could sense her frustration.  CSW validated her feelings.  She referred to baby as "a blessing," but told CSW that, "in a weeks time, my whole entire life changed."  MOB was tearful when she said she had gotten prescriptions for Motrin 800 and Tylenol 3 because now she knows that her baby was exposed to these medications also.  She thinks she received cough syrup that may have had a narcotic in it as well.  CSW helped MOB process her feelings of guilt that she took this medication when she did not know she was growing a fetus that it might affect.  CSW also informed her of the hospital drug  screen policy.  MOB denies any other drug use and states she does not consume alcohol.  She was understanding of the policy and appreciative of the information.  CSW discussed PPD signs and symptoms and urged MOB to talk with her primary care doctor, whom she now has, if she has concerns at any time.  MOB thinks she will be taking her son to [facility] for pediatric follow up and CSW informed her that there is a Education officer, museum there who would be great to talk to if emotional concerns arise.  CSW encouraged her to be gentle with herself as she adjusts to becoming a mother as she did not have as much time to prepare as the average person.  Bonding was evident as MOB held her  baby on her chest while we talked.  She states no emotional concerns at this time, but thanked CSW for the information.    CSW Plan/Description:  No Further Intervention Required/No Barriers to Discharge, Patient/Family Education     Alphonzo Cruise, [place] 01/22/2015, 3:54 PM

## 2015-01-25 NOTE — Lactation Note (Signed)
Lactation Consult  Mother's reason for visit:  Baby hasn't latched well since Saturday night  Visit Type:  Feeding assessment  Appointment Notes:  Confirmed  Consult:  Initial Lactation Consultant:  Kathrin GreathouseIorio, Scarlette Hogston Ann  ________________________________________________________________________  Baby's Name: Amy Ferguson Date of Birth: 01/19/2015 [facility] - per mom was given a list to pick a Dr. F/U apt next week  Gender: female Gestational Age: 5860w3d (At Birth) Birth Weight: 7 lb 3.9 oz (3285 g) Weight at Discharge: Weight: 6 lb 10.7 oz (3025 g)Date of Discharge: 01/22/2015 Filed Weights   01/19/15 2339 01/20/15 2315 01/22/15 0000  Weight: 7 lb 3.9 oz (3285 g) 7 lb 1.4 oz (3215 g) 6 lb 10.7 oz (3025 g)   Last weight taken from location outside of [facility]: 5/23 - 6-15 oz. @ [facility] , Today's weight - 3184 g , 7-0.3 oz      ________________________________________________________________________  Mother's Name: Rolley SimsJuliet L Snavely Type of delivery:  Vaginal with PIH  Breastfeeding Experience: 1st baby  Maternal Medical Conditions:  Per mom PCOS ( LC did not find it is hx ), PIH, Asthma, HTN, Obesity  Maternal Medications:  PNV , Lisinopril l( B/P med ), Motrin   ________________________________________________________________________  Breastfeeding History (Post Discharge)  Frequency of breastfeeding:  Until Sunday night when the baby started struggling with  latch was feeding every 1 hour at night and every 2 hours days.  Duration of feeding:  15 -30 mins  Started supplemented Saturday evening - 30 ml after attempts to latch.   Supplementing:  per mom with Breast milk when available ( pumping with hand pump)  And formula ( similiac ) - about 32 ml after latch   Pumping :  Per mom has been using a hand pump from the hospital. After breast  Feeding and in between feedings , pumping every 1  1/2 - 2 hrs, esp when the baby hasn't been latching. Per mom will be getting her DEBP by Fed - X today ( Lansinoh )   Infant Intake and Output Assessment  Voids:  > 6  in 24 hrs.  Color:  Clear yellow Stools:  > 2-3 in 24 hrs.  Color:  Yellow  ________________________________________________________________________  Maternal Breast Assessment- Assessment of both breast - noted a wide space between the breast.erect nipples bilaterally, breast which are filling  Per mom milk came in at 5 days - breast fuller and amounts pumped off with hand pump have increased to 3 oz. Mom denies any issues with engorgement.  Per mom didn't know she was pregnant until 39 weeks, no she can not recall breast changes in the last 9 plus months. Per mom was being worked up for PCOS ,at the time of the abd. U/S when she was told she was pregnant. Per mom which was total surprise to her.   Breast:  Filling Nipple:  Erect Pain level:  0 Pain interventions:  Expressed breast milk  _______________________________________________________________________ Feeding Assessment/Evaluation - baby alert and awake after pre-weight .  Last fed at 1130 -45 ml of formula   Initial feeding assessment:  Infant's oral assessment:  WNL  Positioning:  Football Right breast  LATCH documentation:  Latch:  2 = Grasps breast easily, tongue down, lips flanged, rhythmical sucking.  Audible swallowing:  2 = Spontaneous and intermittent  Type of nipple:  2 = Everted at rest and after stimulation  Comfort (Breast/Nipple):  1 = Filling, red/small blisters or bruises, mild/mod discomfort  Hold (Positioning):  1 = Assistance needed to correctly position infant at breast and maintain latch  LATCH score: 8   Attached assessment:  Deep  Lips flanged:  Yes.    Lips untucked:  Yes.    Suck assessment:  Nutritive and Nonnutritive more nutritive when 5 F SNS eased into the corner of the baby's mouth while feeding.  Baby seemed some  what fussy latched until the SNS was added , much calmer. Fed for 10 mins ( active feeding ) stayed latched for 15 mins  And fell asleep. LC showed mom how to release suction , nipple appeared normal.  Last feed at home was at 1130 45 ml - Baby wasn't acting overly hungry  Tools:  Syringe with 5 Fr feeding tube Instructed on use and cleaning of tool:  Yes.    Pre-feed weight:  3184 g , 7-0.3 oz  Post-feed weight:  3206 g , 7-1.1 oz  Amount transferred: 22 ml ( 17 breast milk and 5 ml formula from the SNS )  Amount supplemented:  5 ml   Baby only fed on one Breast . Mom knows to feed again with feeding cues at by 2-3 hrs.   Total amount pumped post feed:  Did not post pump at consult   Total amount transferred: 17 ml  Total supplement given:  5 ml   Lactation Impression: Baby latched well, seemed fussy with latch due to flow. When 5 F feeding SNS added with formula , seemed a lot more satisfied and was able to get into a  Consistent feeding pattern. Nutritive and non - nutritive at the breast . Baby didn't seem overly hungry. Recent feeding at 1130 - 45 ml per mom.  Lactation plan of Care -  Mom - rest , naps , plenty fluids , especially water, nutritious snacks and meals. Feedings - every 2 -3 hrs and with feeding cues, cluster feedings normal with growth spurts  Steps for latching - Breast massage , hand express, pre-pump with hand pump 8-10 strokes ,latch with firm support and breast compressions until swallows.  Then intermittent.  Option #1 - Breast feed 1st breast with SNS, ( try starting at 10 ml and increase , offer 2nd breast , pump afterwards both breast for 10 -15 mins. Option #2 - Steps for latching 1st , Feed at the breast if the volume ( breast are full , and Jayce is happy , consistent nutritive pattern , Continue to feed. Option #3 - Feeding a bottle 45 -60 ml and pump both breast for 15 - 20 mins. , save milk and feed it back to baby  Watch for non- nutritive feeding  patterns , if stimulation of breast compressions doesn't get Jayce back in to a nutritive pattern , release suction.  Skin to skin feedings until Jayce can stay awake for feeding   F/U with [facility] office - 6/2 Thursday at 1030 am , apt reminder given to mom  Mom is an asthmatic - can't take Fenugreek , "Moringa supplement " also will increase milk supply . ( information sheet given to mom )  Lactation Bar Cookies recipe given. ( enhance milk supply )

## 2015-01-26 ENCOUNTER — Ambulatory Visit (HOSPITAL_COMMUNITY)
Admission: RE | Admit: 2015-01-26 | Discharge: 2015-01-26 | Disposition: A | Discharge status: Home / Self Care | Payer: PRIVATE HEALTH INSURANCE | Source: Ambulatory Visit | Attending: Family Medicine | Admitting: Family Medicine

## 2015-01-27 ENCOUNTER — Ambulatory Visit: Payer: Medicaid Other | Admitting: Obstetrics & Gynecology

## 2015-01-28 ENCOUNTER — Encounter (HOSPITAL_COMMUNITY): Payer: Self-pay | Admitting: *Deleted

## 2015-01-28 ENCOUNTER — Inpatient Hospital Stay (HOSPITAL_COMMUNITY)
Admission: EM | Admit: 2015-01-28 | Discharge: 2015-01-28 | Disposition: A | Discharge status: Home / Self Care | Payer: PRIVATE HEALTH INSURANCE | Source: Ambulatory Visit | Attending: Obstetrics & Gynecology | Admitting: Obstetrics & Gynecology

## 2015-01-28 ENCOUNTER — Telehealth: Payer: Self-pay | Admitting: *Deleted

## 2015-01-28 DIAGNOSIS — Z833 Family history of diabetes mellitus: Secondary | ICD-10-CM | POA: Diagnosis not present

## 2015-01-28 DIAGNOSIS — J45909 Unspecified asthma, uncomplicated: Secondary | ICD-10-CM | POA: Insufficient documentation

## 2015-01-28 DIAGNOSIS — Z79899 Other long term (current) drug therapy: Secondary | ICD-10-CM | POA: Diagnosis not present

## 2015-01-28 DIAGNOSIS — Z8249 Family history of ischemic heart disease and other diseases of the circulatory system: Secondary | ICD-10-CM | POA: Diagnosis not present

## 2015-01-28 DIAGNOSIS — O1093 Unspecified pre-existing hypertension complicating the puerperium: Secondary | ICD-10-CM | POA: Diagnosis not present

## 2015-01-28 DIAGNOSIS — Z91040 Latex allergy status: Secondary | ICD-10-CM | POA: Insufficient documentation

## 2015-01-28 DIAGNOSIS — O169 Unspecified maternal hypertension, unspecified trimester: Secondary | ICD-10-CM | POA: Diagnosis not present

## 2015-01-28 DIAGNOSIS — O165 Unspecified maternal hypertension, complicating the puerperium: Secondary | ICD-10-CM

## 2015-01-28 DIAGNOSIS — I10 Essential (primary) hypertension: Secondary | ICD-10-CM | POA: Diagnosis present

## 2015-01-28 LAB — CBC WITH DIFFERENTIAL/PLATELET
Basophils Absolute: 0 10*3/uL (ref 0.0–0.1)
Basophils Relative: 0 % (ref 0–1)
Eosinophils Absolute: 0.1 10*3/uL (ref 0.0–0.7)
Eosinophils Relative: 1 % (ref 0–5)
HCT: 30.7 % — ABNORMAL LOW (ref 36.0–46.0)
HEMOGLOBIN: 9.5 g/dL — AB (ref 12.0–15.0)
LYMPHS ABS: 3.1 10*3/uL (ref 0.7–4.0)
Lymphocytes Relative: 48 % — ABNORMAL HIGH (ref 12–46)
MCH: 24.2 pg — ABNORMAL LOW (ref 26.0–34.0)
MCHC: 30.9 g/dL (ref 30.0–36.0)
MCV: 78.3 fL (ref 78.0–100.0)
MONO ABS: 0.4 10*3/uL (ref 0.1–1.0)
Monocytes Relative: 6 % (ref 3–12)
NEUTROS PCT: 45 % (ref 43–77)
Neutro Abs: 3 10*3/uL (ref 1.7–7.7)
PLATELETS: 459 10*3/uL — AB (ref 150–400)
RBC: 3.92 MIL/uL (ref 3.87–5.11)
RDW: 16.6 % — AB (ref 11.5–15.5)
WBC: 6.5 10*3/uL (ref 4.0–10.5)

## 2015-01-28 LAB — URINALYSIS, ROUTINE W REFLEX MICROSCOPIC
Bilirubin Urine: NEGATIVE
Glucose, UA: NEGATIVE mg/dL
Ketones, ur: NEGATIVE mg/dL
NITRITE: NEGATIVE
PH: 6 (ref 5.0–8.0)
Protein, ur: NEGATIVE mg/dL
Specific Gravity, Urine: 1.02 (ref 1.005–1.030)
Urobilinogen, UA: 0.2 mg/dL (ref 0.0–1.0)

## 2015-01-28 LAB — PROTEIN / CREATININE RATIO, URINE
CREATININE, URINE: 125 mg/dL
Protein Creatinine Ratio: 0.27 mg/mg{Cre} — ABNORMAL HIGH (ref 0.00–0.15)
Total Protein, Urine: 34 mg/dL

## 2015-01-28 LAB — URINE MICROSCOPIC-ADD ON

## 2015-01-28 LAB — COMPREHENSIVE METABOLIC PANEL
ALT: 11 U/L — AB (ref 14–54)
AST: 16 U/L (ref 15–41)
Albumin: 3.3 g/dL — ABNORMAL LOW (ref 3.5–5.0)
Alkaline Phosphatase: 79 U/L (ref 38–126)
Anion gap: 5 (ref 5–15)
BUN: 9 mg/dL (ref 6–20)
CALCIUM: 9.1 mg/dL (ref 8.9–10.3)
CO2: 24 mmol/L (ref 22–32)
Chloride: 106 mmol/L (ref 101–111)
Creatinine, Ser: 0.66 mg/dL (ref 0.44–1.00)
Glucose, Bld: 71 mg/dL (ref 65–99)
POTASSIUM: 4 mmol/L (ref 3.5–5.1)
Sodium: 135 mmol/L (ref 135–145)
TOTAL PROTEIN: 6.6 g/dL (ref 6.5–8.1)
Total Bilirubin: 0.5 mg/dL (ref 0.3–1.2)

## 2015-01-28 LAB — URIC ACID: Uric Acid, Serum: 6.3 mg/dL (ref 2.3–6.6)

## 2015-01-28 LAB — LACTATE DEHYDROGENASE: LDH: 160 U/L (ref 98–192)

## 2015-01-28 MED ORDER — LISINOPRIL 20 MG PO TABS: 20.0000 mg | ORAL_TABLET | Freq: Two times a day (BID) | ORAL | Status: DC

## 2015-01-28 MED ORDER — LACTATED RINGERS IV SOLN
INTRAVENOUS | Status: DC
  Administered 2015-01-28: 16:00:00 via INTRAVENOUS

## 2015-01-28 MED ORDER — HYDRALAZINE HCL 20 MG/ML IJ SOLN
10.0000 mg | Freq: Once | INTRAMUSCULAR | Status: AC | PRN
  Administered 2015-01-28: 10 mg via INTRAVENOUS
  Filled 2015-01-28: qty 1

## 2015-01-28 MED ORDER — LISINOPRIL 20 MG PO TABS
20.0000 mg | ORAL_TABLET | ORAL | Status: AC
  Administered 2015-01-28: 20 mg via ORAL
  Filled 2015-01-28: qty 1

## 2015-01-28 MED ORDER — HYDRALAZINE HCL 20 MG/ML IJ SOLN
10.0000 mg | Freq: Once | INTRAMUSCULAR | Status: AC
  Administered 2015-01-28: 10 mg via INTRAVENOUS
  Filled 2015-01-28: qty 1

## 2015-01-28 MED ORDER — HYDROCHLOROTHIAZIDE 25 MG PO TABS: 25.0000 mg | ORAL_TABLET | Freq: Every day | ORAL | Status: DC

## 2015-01-28 MED ORDER — LABETALOL HCL 5 MG/ML IV SOLN
20.0000 mg | INTRAVENOUS | Status: AC | PRN
  Administered 2015-01-28: 20 mg via INTRAVENOUS
  Administered 2015-01-28: 40 mg via INTRAVENOUS
  Administered 2015-01-28: 80 mg via INTRAVENOUS
  Filled 2015-01-28: qty 4
  Filled 2015-01-28: qty 16
  Filled 2015-01-28: qty 8

## 2015-01-28 MED ORDER — HYDROCHLOROTHIAZIDE 25 MG PO TABS
25.0000 mg | ORAL_TABLET | ORAL | Status: AC
  Administered 2015-01-28: 25 mg via ORAL
  Filled 2015-01-28: qty 1

## 2015-01-28 NOTE — Discharge Instructions (Signed)
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
## 2015-01-28 NOTE — MAU Note (Addendum)
Pt seen by Siri ColeBaby Love Nurse at home, was advised to come in due to elevated BP.  [facility] & was also advised to come to MAU.  Pt had vag delivery on 5/17, had not had PNC.

## 2015-01-28 NOTE — Telephone Encounter (Signed)
[facility] nurse called to report that patients bp readings are 190/110 and 200/120. Advised her to send patient to MAU immediately. She states that she will inform patient.

## 2015-01-28 NOTE — MAU Note (Signed)
Pt to be dc to home.

## 2015-01-29 ENCOUNTER — Ambulatory Visit (HOSPITAL_COMMUNITY): Payer: PRIVATE HEALTH INSURANCE

## 2015-02-04 ENCOUNTER — Ambulatory Visit: Payer: PRIVATE HEALTH INSURANCE

## 2015-02-04 ENCOUNTER — Ambulatory Visit (HOSPITAL_COMMUNITY): Admission: RE | Admit: 2015-02-04 | Payer: PRIVATE HEALTH INSURANCE | Source: Ambulatory Visit

## 2015-02-04 DIAGNOSIS — I10 Essential (primary) hypertension: Secondary | ICD-10-CM

## 2015-02-04 NOTE — Progress Notes (Signed)
Patient here today for BP check. Currently lisinopril 20mg  BID and HCTZ 25mg  daily-- been taking this regimen for the past week. Mom is a Charity fundraiserN and has been checking patient's BPs-- ranging from 122-137/91-100. Denies any HA, SOB, abdominal pain, visual changes or swelling. Consulted with Dr. Debroah LoopArnold who states patient should continue daily regimen and return for PP visit on 03/01/15. Advised patient continue to check BP and come to MAU if any continuous headaches unrelieved by ibuprofen, SOB, visual changes, severe upper abdominal pain. Patient verbalized understanding and gratitude. No questions or concerns.

## 2015-03-01 ENCOUNTER — Ambulatory Visit (INDEPENDENT_AMBULATORY_CARE_PROVIDER_SITE_OTHER): Payer: PRIVATE HEALTH INSURANCE | Admitting: Family Medicine

## 2015-03-01 ENCOUNTER — Encounter: Payer: Self-pay | Admitting: Family Medicine

## 2015-03-01 VITALS — BP 133/93 | HR 78 | Temp 98.2°F | Ht 62.0 in | Wt 227.2 lb

## 2015-03-01 DIAGNOSIS — Z1151 Encounter for screening for human papillomavirus (HPV): Secondary | ICD-10-CM | POA: Diagnosis not present

## 2015-03-01 DIAGNOSIS — Z01419 Encounter for gynecological examination (general) (routine) without abnormal findings: Secondary | ICD-10-CM

## 2015-03-01 DIAGNOSIS — Z124 Encounter for screening for malignant neoplasm of cervix: Secondary | ICD-10-CM

## 2015-03-01 MED ORDER — HYDROCHLOROTHIAZIDE 25 MG PO TABS: 25.0000 mg | ORAL_TABLET | Freq: Every day | ORAL | Status: DC

## 2015-03-01 NOTE — Progress Notes (Signed)
Subjective:     Amy SimsJuliet L Ferguson is a 34 y.o. female who presents for a postpartum visit. She is 6 weeks postpartum following a spontaneous vaginal delivery, IOL 2/2 preEclampsia with severe features at 40w.3d.  Patient had not received any prenatal care as she reported she did not know she was pregnant. I have fully reviewed the prenatal and intrapartum course. The delivery was at 40 gestational weeks. Outcome: spontaneous vaginal delivery. Anesthesia: epidural. Postpartum course has been complicated by elevated blood pressure.  She was seen in MAU where lisinopril was started, added to HCTZ. Baby's course has been uncomplicated. Baby is feeding by breast and bottle. Bleeding no bleeding. Bowel function is normal. Bladder function is normal. Patient is not sexually active. Contraception method is abstinence. Postpartum depression screening: negative.  The following portions of the patient's history were reviewed and updated as appropriate: allergies, current medications, past family history, past medical history, past social history, past surgical history and problem list.  Review of Systems Pertinent items are noted in HPI.   Objective:    BP 133/93 mmHg  Pulse 78  Temp(Src) 98.2 F (36.8 C) (Oral)  Ht 5\' 2"  (1.575 m)  Wt 227 lb 3.2 oz (103.057 kg)  BMI 41.54 kg/m2  Breastfeeding? Yes  General:  alert and cooperative   Breasts:  declined  Lungs: clear to auscultation bilaterally  Heart:  regular rate  Abdomen: soft, non-tender; bowel sounds normal; no masses,  no organomegaly   Vulva:  normal  Vagina: normal vagina  Cervix:  multiparous appearance  Corpus: normal  Adnexa:  normal adnexa  Rectal Exam: Not performed.        Assessment:     normal postpartum exam. Pap smear done at today's visit.   Plan:    1. Contraception: abstinence  2. Elevated BP postpartum: advised to stop lisinopril, may continue HCTZ 3. Follow up in:as needed.

## 2015-03-01 NOTE — Patient Instructions (Signed)
F/u with your PCP in 2 weeks to follow up your high blood pressure

## 2015-03-03 LAB — CYTOLOGY - PAP

## 2015-04-06 ENCOUNTER — Encounter: Payer: Self-pay | Admitting: Family Medicine

## 2015-10-29 ENCOUNTER — Encounter: Payer: Self-pay | Admitting: Internal Medicine

## 2019-12-01 ENCOUNTER — Ambulatory Visit: Payer: PRIVATE HEALTH INSURANCE | Attending: Internal Medicine

## 2019-12-01 DIAGNOSIS — Z20822 Contact with and (suspected) exposure to covid-19: Secondary | ICD-10-CM

## 2019-12-03 LAB — SARS-COV-2, NAA 2 DAY TAT

## 2019-12-03 LAB — NOVEL CORONAVIRUS, NAA: SARS-CoV-2, NAA: NOT DETECTED

## 2020-04-28 ENCOUNTER — Telehealth: Payer: Self-pay | Admitting: Physician Assistant

## 2020-04-28 NOTE — Telephone Encounter (Signed)
Called to discuss with Amy Ferguson about Covid symptoms and the use of casirivimab and imdevimab, a monoclonal antibody infusion for those with mild to moderate Covid symptoms and at a high risk of hospitalization.     Pt is qualified for this infusion at the [facility] infusion center due to co-morbid conditions and/or a member of an at-risk group, however declines infusion at this time. Symptoms tier reviewed as well as criteria for ending isolation.  Symptoms reviewed that would warrant ED/Hospital evaluation. Preventative practices reviewed. Patient verbalized understanding. Patient advised to call back if he decides that he does want to get infusion. Callback number to the infusion center given. Patient advised to go to Urgent care or ED with severe symptoms.   Onset of symptoms 04/23/20 Risk factors: HTN, [place] and BMI of > 35.    Patient Active Problem List   Diagnosis Date Noted  . Vaginal delivery 01/22/2015  . Chronic hypertension during pregnancy, antepartum 01/18/2015  . Chronic hypertension in pregnancy 01/18/2015  . Irregular menstrual cycle 10/30/2014  . Health care maintenance 10/30/2014  . Asthma, chronic 10/29/2014  . HTN (hypertension) 10/29/2014  . Abdominal pain, left lower quadrant 10/29/2014  . Severe obesity (BMI >= 40) (HCC) 10/29/2014  . Seasonal allergies 10/29/2014     Manson Passey, PA

## 2020-04-29 ENCOUNTER — Other Ambulatory Visit: Payer: Self-pay | Admitting: Physician Assistant

## 2020-04-29 DIAGNOSIS — U071 COVID-19: Secondary | ICD-10-CM

## 2020-04-29 DIAGNOSIS — I10 Essential (primary) hypertension: Secondary | ICD-10-CM

## 2020-04-29 DIAGNOSIS — J4520 Mild intermittent asthma, uncomplicated: Secondary | ICD-10-CM

## 2020-04-29 NOTE — Progress Notes (Signed)
I connected by phone with Amy Ferguson on 04/29/2020 at 8:59 AM to discuss the potential use of a new treatment for mild to moderate COVID-19 viral infection in non-hospitalized patients.  This patient is a 38 y.o. female that meets the FDA criteria for Emergency Use Authorization of COVID monoclonal antibody casirivimab/imdevimab.  Has a (+) direct SARS-CoV-2 viral test result  Has mild or moderate COVID-19   Is NOT hospitalized due to COVID-19  Is within 10 days of symptom onset  Has at least one of the high risk factor(s) for progression to severe COVID-19 and/or hospitalization as defined in EUA.  Specific high risk criteria : BMI > 25, Cardiovascular disease or hypertension and Chronic Lung Disease   I have spoken and communicated the following to the patient or parent/caregiver regarding COVID monoclonal antibody treatment:  1. FDA has authorized the emergency use for the treatment of mild to moderate COVID-19 in adults and pediatric patients with positive results of direct SARS-CoV-2 viral testing who are 31 years of age and older weighing at least 40 kg, and who are at high risk for progressing to severe COVID-19 and/or hospitalization.  2. The significant known and potential risks and benefits of COVID monoclonal antibody, and the extent to which such potential risks and benefits are unknown.  3. Information on available alternative treatments and the risks and benefits of those alternatives, including clinical trials.  4. Patients treated with COVID monoclonal antibody should continue to self-isolate and use infection control measures (e.g., wear mask, isolate, social distance, avoid sharing personal items, clean and disinfect "high touch" surfaces, and frequent handwashing) according to CDC guidelines.   5. The patient or parent/caregiver has the option to accept or refuse COVID monoclonal antibody treatment.  After reviewing this information with the patient, The patient  agreed to proceed with receiving casirivimab\imdevimab infusion and will be provided a copy of the Fact sheet prior to receiving the infusion.   Wasil Wolke 04/29/2020 8:59 AM

## 2020-04-30 ENCOUNTER — Ambulatory Visit (HOSPITAL_COMMUNITY): Payer: PRIVATE HEALTH INSURANCE

## 2020-05-01 ENCOUNTER — Ambulatory Visit (HOSPITAL_COMMUNITY)
Admission: RE | Admit: 2020-05-01 | Discharge: 2020-05-01 | Disposition: A | Discharge status: Home / Self Care | Payer: Medicaid Other | Source: Ambulatory Visit | Attending: Pulmonary Disease | Admitting: Pulmonary Disease

## 2020-05-01 DIAGNOSIS — U071 COVID-19: Secondary | ICD-10-CM | POA: Diagnosis present

## 2020-05-01 MED ORDER — METHYLPREDNISOLONE SODIUM SUCC 125 MG IJ SOLR: 125.0000 mg | Freq: Once | INTRAMUSCULAR | Status: DC | PRN

## 2020-05-01 MED ORDER — SODIUM CHLORIDE 0.9 % IV SOLN
1200.0000 mg | Freq: Once | INTRAVENOUS | Status: AC
  Administered 2020-05-01: 1200 mg via INTRAVENOUS
  Filled 2020-05-01: qty 10

## 2020-05-01 MED ORDER — ALBUTEROL SULFATE HFA 108 (90 BASE) MCG/ACT IN AERS: 2.0000 | INHALATION_SPRAY | Freq: Once | RESPIRATORY_TRACT | Status: DC | PRN

## 2020-05-01 MED ORDER — FAMOTIDINE IN NACL 20-0.9 MG/50ML-% IV SOLN: 20.0000 mg | Freq: Once | INTRAVENOUS | Status: DC | PRN

## 2020-05-01 MED ORDER — SODIUM CHLORIDE 0.9 % IV SOLN: INTRAVENOUS | Status: DC | PRN

## 2020-05-01 MED ORDER — DIPHENHYDRAMINE HCL 50 MG/ML IJ SOLN: 50.0000 mg | Freq: Once | INTRAMUSCULAR | Status: DC | PRN

## 2020-05-01 MED ORDER — EPINEPHRINE 0.3 MG/0.3ML IJ SOAJ: 0.3000 mg | Freq: Once | INTRAMUSCULAR | Status: DC | PRN

## 2020-05-01 NOTE — Progress Notes (Signed)
  Diagnosis: COVID-19  Physician: Patrick Wright, MD  Procedure: Covid Infusion Clinic Med: casirivimab\imdevimab infusion - Provided patient with casirivimab\imdevimab fact sheet for patients, parents and caregivers prior to infusion.  Complications: No immediate complications noted.  Discharge: Discharged home   Annaleia Pence N Tattiana Fakhouri 05/01/2020  

## 2020-05-01 NOTE — Discharge Instructions (Signed)

## 2021-01-04 ENCOUNTER — Encounter (HOSPITAL_BASED_OUTPATIENT_CLINIC_OR_DEPARTMENT_OTHER): Payer: Self-pay | Admitting: *Deleted

## 2021-01-04 ENCOUNTER — Emergency Department (HOSPITAL_BASED_OUTPATIENT_CLINIC_OR_DEPARTMENT_OTHER): Payer: Medicaid Other | Admitting: Radiology

## 2021-01-04 ENCOUNTER — Other Ambulatory Visit: Payer: Self-pay

## 2021-01-04 ENCOUNTER — Emergency Department (HOSPITAL_BASED_OUTPATIENT_CLINIC_OR_DEPARTMENT_OTHER)
Admission: EM | Admit: 2021-01-04 | Discharge: 2021-01-04 | Disposition: A | Discharge status: Home / Self Care | Payer: Medicaid Other | Attending: Emergency Medicine | Admitting: Emergency Medicine

## 2021-01-04 DIAGNOSIS — Z79899 Other long term (current) drug therapy: Secondary | ICD-10-CM | POA: Insufficient documentation

## 2021-01-04 DIAGNOSIS — I10 Essential (primary) hypertension: Secondary | ICD-10-CM | POA: Diagnosis not present

## 2021-01-04 DIAGNOSIS — Z91040 Latex allergy status: Secondary | ICD-10-CM | POA: Insufficient documentation

## 2021-01-04 DIAGNOSIS — S8391XA Sprain of unspecified site of right knee, initial encounter: Secondary | ICD-10-CM | POA: Insufficient documentation

## 2021-01-04 DIAGNOSIS — S8991XA Unspecified injury of right lower leg, initial encounter: Secondary | ICD-10-CM | POA: Diagnosis present

## 2021-01-04 DIAGNOSIS — W108XXA Fall (on) (from) other stairs and steps, initial encounter: Secondary | ICD-10-CM | POA: Diagnosis not present

## 2021-01-04 MED ORDER — HYDROCHLOROTHIAZIDE 25 MG PO TABS
25.0000 mg | ORAL_TABLET | Freq: Every day | ORAL | 1 refills | Status: DC
Start: 2021-01-04 — End: 2024-03-13
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
## 2021-01-04 NOTE — ED Triage Notes (Signed)
Patient fell last Sunday in a flight of stairs (8 ) and hurt her right knee.  No LOC.

## 2022-04-15 IMAGING — DX DG KNEE COMPLETE 4+V*R*
4 series · 4 of 4 positions shown · non-contrast
Comparison: None

CLINICAL DATA: Pain after fall.  RIGHT knee pain since.

EXAM:
RIGHT KNEE - COMPLETE 4+ VIEW

[knee ap]
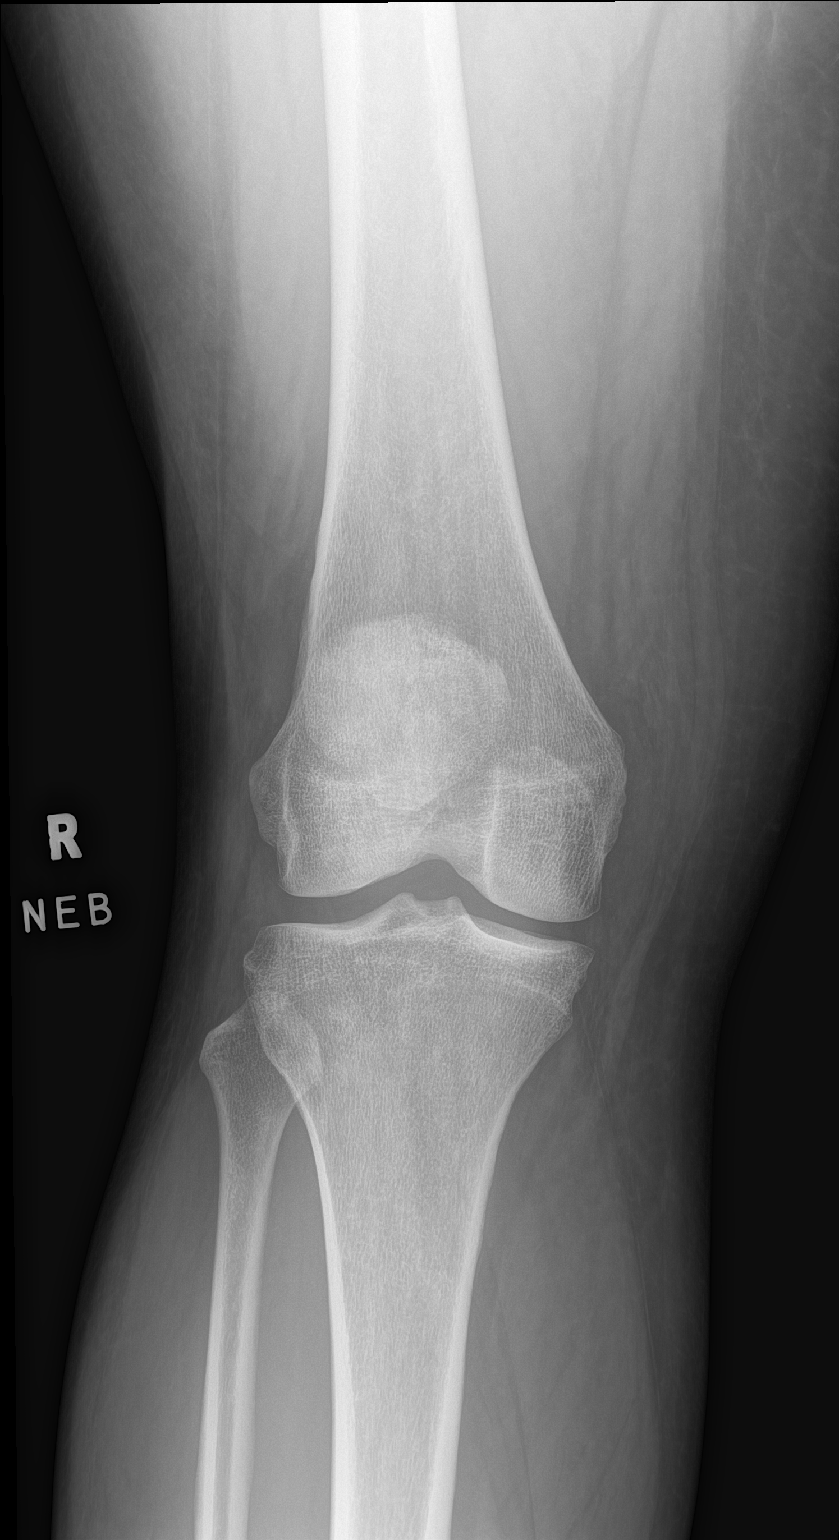

[knee obl (1 of 2)]
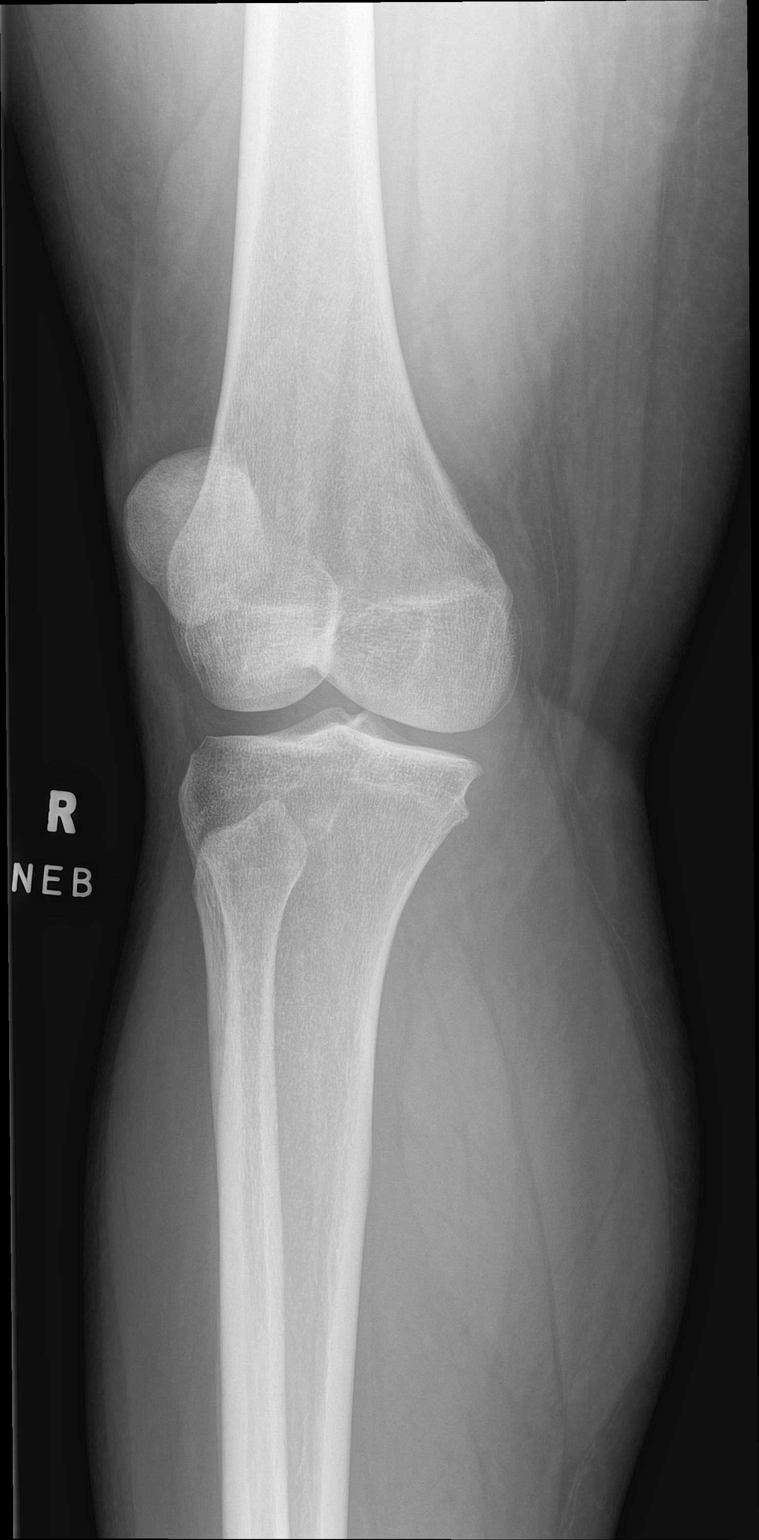

[knee obl (2 of 2)]
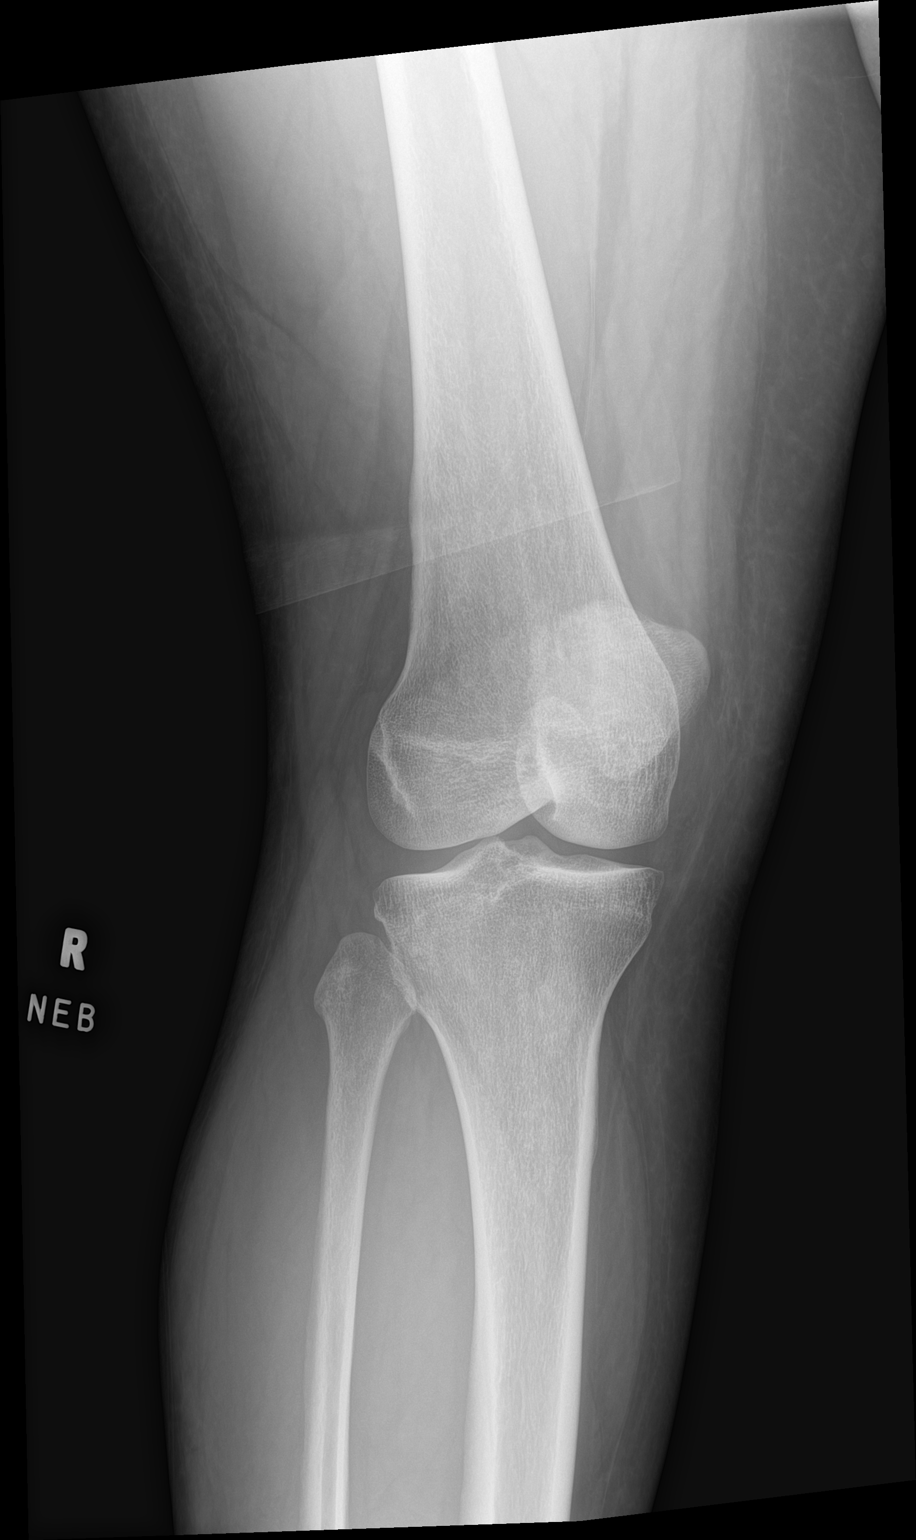

[knee lat]
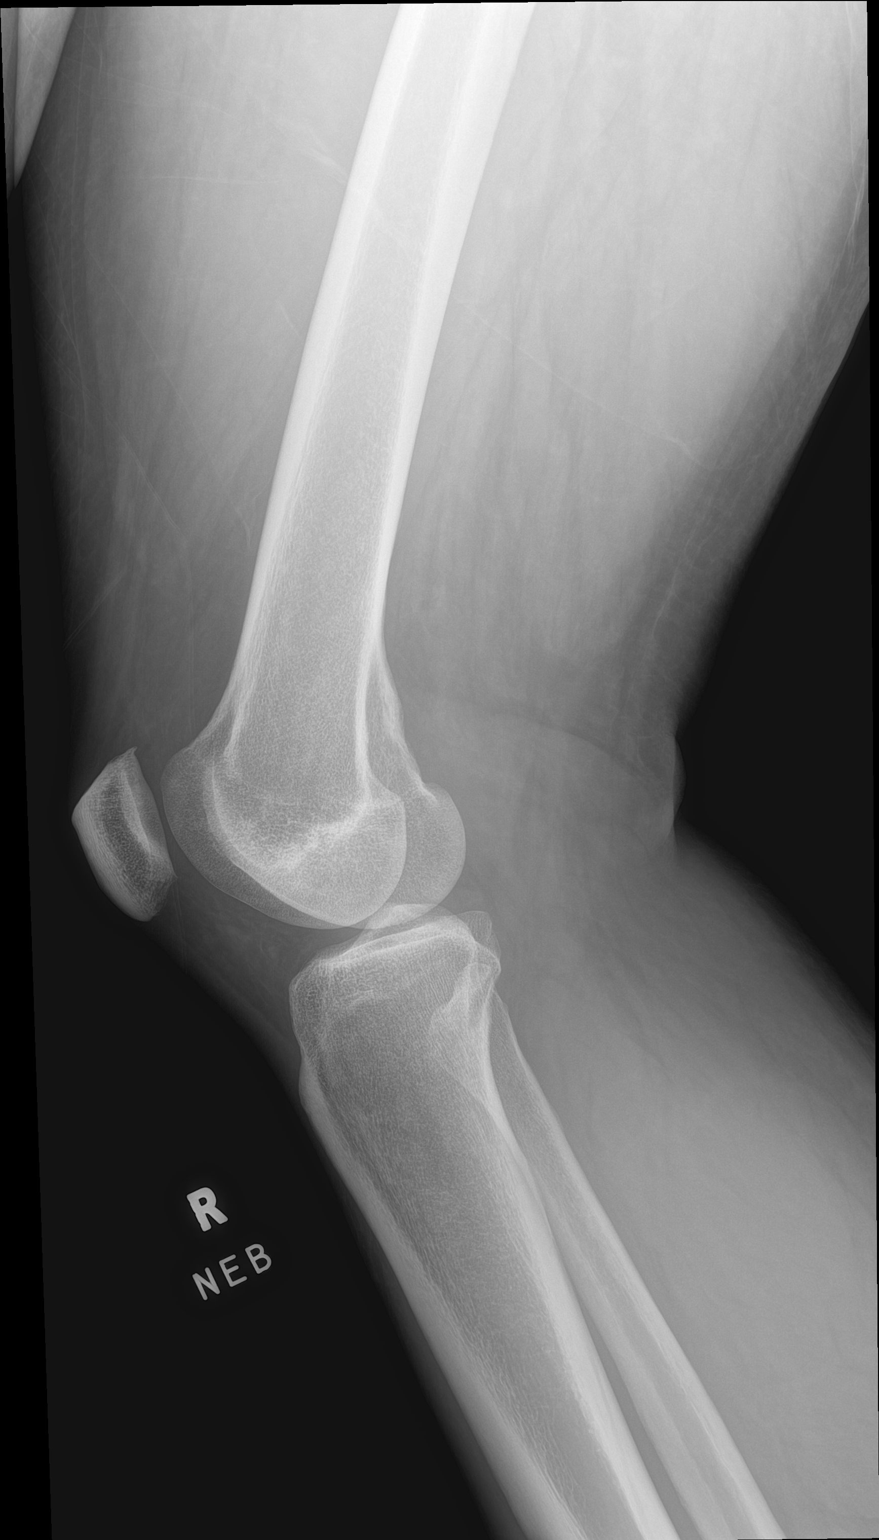

[4 of 4 positions shown; findings below may reference images not displayed]

FINDINGS: No significant soft tissue swelling or sign of effusion about the
RIGHT knee.

Mild patellofemoral degenerative changes.

Mild irregularity of the patella suggested on AP view may represent
developmental changes in the patella given lack of visible overlying
soft tissue swelling.
IMPRESSION: No visible fracture or dislocation. Mild irregularity of the patella
on AP view may be developmental or sequela of prior trauma given
lack of overlying soft tissue swelling. Correlate with any point
tenderness in this location.

## 2023-11-23 ENCOUNTER — Other Ambulatory Visit (HOSPITAL_COMMUNITY): Payer: Self-pay | Admitting: Surgery

## 2023-12-03 ENCOUNTER — Encounter: Attending: Surgery | Admitting: Dietician

## 2023-12-03 ENCOUNTER — Ambulatory Visit: Admitting: Dietician

## 2023-12-03 ENCOUNTER — Encounter: Payer: Self-pay | Admitting: Dietician

## 2023-12-03 VITALS — Ht 63.0 in | Wt 256.8 lb

## 2023-12-03 DIAGNOSIS — E669 Obesity, unspecified: Secondary | ICD-10-CM | POA: Insufficient documentation

## 2023-12-03 NOTE — Progress Notes (Signed)
 Nutrition Assessment for Bariatric Surgery: Pre-Surgery Behavioral and Nutrition Intervention Program   Medical Nutrition Therapy  Appt Start Time: 1539    End Time: 1638  Patient was seen on 12/03/2023 for Pre-Operative Nutrition Assessment. Purpose of todays visit  enhance perioperative outcomes along with a healthy weight maintenance   Referral stated Supervised Weight Loss (SWL) visits needed: 12  Planned surgery: RYGB Pt expectation of surgery: no set number; healthy weight  NUTRITION ASSESSMENT   Anthropometrics  Start weight at [facility]: 256.8 lbs (date: 12/03/2023)  Height: 63 in BMI: 45.49 kg/m2     Clinical   Medical hx: hypercholesterolemia, asthma, HTN, obesity Medications: Qulipta for migraines, albuterol, Advair, and vitamin D3, valsartan, metoprolol, amlodipine, and hydrochlorothiazide, Ozempic, asthma, trazodone (insomnia) Labs: no recent labs in EMR Notable signs/symptoms: none noted Any previous deficiencies? No  Evaluation of Nutritional Deficiencies: Micronutrient Nutrition Focused Physical Exam: Hair: No issues observed Eyes: No issues observed Mouth: No issues observed Neck: No issues observed Nails: No issues observed Skin: No issues observed  Lifestyle & Dietary Hx  Previous weight loss attempts have included exercise programs, medication, multiple efforts at each of weight watchers, Nutrisystem's, Slim fast, protein shakes, calorie restriction, carbohydrate restriction, and fasting.  Pt states she has an 21 year old son stating she wants to be active with her son. Pt states her son has ADHD, stating he needs more stimulation and she gets over stimulated. Pt states work is busy at [employer]. Pt states she has had pain in neck/shoulders from stress lately.  Pt states she has pretty much cut out soda.  Current Physical Activity Recommendations state 150 minutes per week of moderate to vigorous movement including Cardio and 1-2 days of resistance  activities as well as flexibility/balance activities:  Pts current physical activity: walking 15-20 minutes 3 days per week, with 40% recommendation reached    Sleep Hygiene: duration and quality: has trazodone for insomnia, stating she has not taken it in several months.  Current Patient Perceived Stress Level as stated by pt on a scale of 1-10:  10       Stress Management Techniques: step away from things, crafting, music  According to the Dietary Guidelines for Americans Recommendation: equivalent 1.5-2 cups fruits per day, equivalent 2-3 cups vegetables per day and at least half all grains whole  Fruit servings per day (on average): 1, meeting 50% recommendation  Non-starchy vegetable servings per day (on average): 1, meeting 33-50% recommendation  Whole Grains per day (on average): 0  Number of meals missed/skipped per week out of 21: 7-14  24-Hr Dietary Recall First Meal: applesauce or skip or protein shake or bagel w/ cream cheese and fruit or bacon Snack:  Second Meal: skip or salad with grilled chicken or cereal and applesauce Snack: grapes or other fruit Third Meal: chicken baked or beef or pork, potato or rice, vegetable Snack: grapes or other fruit Beverages: water, water with flavorings  Alcoholic beverages per week: 0 (socially a couple of times a year)   Estimated Energy Needs Calories: 1500  NUTRITION DIAGNOSIS  Overweight/obesity ([place]-3.3) related to past poor dietary habits and physical inactivity as evidenced by patient w/ planned RYGB surgery following dietary guidelines for continued weight loss.  NUTRITION INTERVENTION  Nutrition counseling (C-1) and education (E-2) to facilitate bariatric surgery goals.  Educated pt on micronutrient deficiencies post-surgery and behavioral/dietary strategies to start in order to mitigate that risk   Behavioral and Dietary Interventions Pre-Op Goals Reviewed with the Patient Nutrition: Healthy  Eating Behaviors Switch to  non-caloric, non-carbonated and non-caffeinated beverages such as  water, unsweetened tea, Crystal Light and zero calorie beverages (aim for 64 oz. per day) Cut out grazing between meals or at night  Find a protein shake you like Eat every 3-5 hours        Eliminate distractions while eating (TV, computer, reading, driving, texting) Take 16-10 minutes to eat a meal  Decrease high sugar foods/decrease high fat/fried foods Eliminate alcoholic beverages Increase protein intake (eggs, fish, chicken, yogurt) before surgery Eat non starchy vegetables 2 times a day 7 days a week Eat complex carbohydrates such as whole grains and fruits   Behavioral Modification: Physical Activity Increase my usual daily activity (use stairs, park farther, etc.) Engage in _______________________  activity  _______ minutes ______ times per week  Other:    _________________________________________________________________     Problem Solving I will think about my usual eating patterns and how to tweak them How can my friends and family support me Barriers to starting my changes Learn and understand appetite verses hunger   Healthy Coping Allow for ___________ activities per week to help me manage stress Reframe negative thoughts I will keep a picture of someone or something that is my inspiration & look at it daily   Monitoring  Weigh myself once a week  Measure my progress by monitoring how my clothes fit Keep a food record of what I eat and drink for the next ________ (time period) Take pictures of what I eat and drink for the next ________ (time period) Use an app to count steps/day for the next_______ (time period) Measure my progress such as increased energy and more restful sleep Monitor your acid reflux and bowel habits, are they getting better?   *Goals that are bolded indicate the pt would like to start working towards these  Handouts Provided Include  Bariatric Surgery handouts (Nutrition  Visits, Pre Surgery Behavioral Change Goals, Protein Shakes Brands to Choose From, Vitamins & Mineral Supplementation)  Learning Style & Readiness for Change Teaching method utilized: Visual, Auditory, and hands on  Demonstrated degree of understanding via: Teach Back  Readiness Level: preparation Barriers to learning/adherence to lifestyle change: nothing identified  RD's Notes for Next Visit Patient progress toward chosen goals.    MONITORING & EVALUATION Dietary intake, weekly physical activity, body weight, and preoperative behavioral change goals   Next Steps  Patient is to follow up at [facility] in two weeks for first SWL visit.

## 2023-12-11 ENCOUNTER — Ambulatory Visit (HOSPITAL_COMMUNITY)
Admission: RE | Admit: 2023-12-11 | Discharge: 2023-12-11 | Disposition: A | Discharge status: Home / Self Care | Source: Ambulatory Visit | Attending: Surgery | Admitting: Surgery

## 2023-12-11 ENCOUNTER — Other Ambulatory Visit (HOSPITAL_COMMUNITY): Payer: Self-pay | Admitting: Surgery

## 2023-12-11 ENCOUNTER — Other Ambulatory Visit: Payer: Self-pay

## 2023-12-11 ENCOUNTER — Encounter (HOSPITAL_COMMUNITY)
Admission: RE | Admit: 2023-12-11 | Discharge: 2023-12-11 | Disposition: A | Discharge status: Home / Self Care | Source: Ambulatory Visit | Attending: Surgery | Admitting: Surgery

## 2023-12-11 ENCOUNTER — Encounter (HOSPITAL_COMMUNITY): Payer: Self-pay

## 2023-12-11 DIAGNOSIS — Z01818 Encounter for other preprocedural examination: Secondary | ICD-10-CM | POA: Insufficient documentation

## 2023-12-17 ENCOUNTER — Encounter: Payer: Self-pay | Admitting: Dietician

## 2023-12-17 ENCOUNTER — Encounter: Attending: Surgery | Admitting: Dietician

## 2023-12-17 VITALS — Ht 63.0 in | Wt 255.0 lb

## 2023-12-17 DIAGNOSIS — E669 Obesity, unspecified: Secondary | ICD-10-CM | POA: Insufficient documentation

## 2023-12-17 NOTE — Progress Notes (Signed)
 Supervised Weight Loss Visit Bariatric Nutrition Education Appt Start Time: 1706    End Time: 1722  Planned surgery: RYGB Pt expectation of surgery: no set number; healthy weight  Referral stated Supervised Weight Loss (SWL) visits needed: 12  1 out of 12 SWL Appointments   NUTRITION ASSESSMENT   Anthropometrics  Start weight at [facility]: 256.8 lbs (date: 12/03/2023)  Height: 63 in Weight today: 255.0 lbs BMI: 44.17 kg/m2     Clinical   Medical hx: hypercholesterolemia, asthma, HTN, obesity Medications: Qulipta for migraines, albuterol, Advair, and vitamin D3, valsartan, metoprolol, amlodipine, and hydrochlorothiazide, Ozempic, asthma, trazodone (insomnia) Labs: no recent labs in EMR Notable signs/symptoms: none noted Any previous deficiencies? No  Lifestyle & Dietary Hx  Pt states she has only had one soda since last visit. Pt wasn't sure how much fluid she is getting each day. Pt states she just started taking an iron supplement. Pt states she is eating a variety of non-starchy vegetables. Pt states she has done better not skipping meals, stating she has not had anything to eat today, but doing better since last visit.  Estimated daily fluid intake: not sure, but about 48 oz Supplements: vit D, iron (65 mg every other day) Current average weekly physical activity: walking 2-3 days a week, for 20-30 minutes  24-Hr Dietary Recall First Meal: applesauce or skip or protein shake or bagel w/ cream cheese and fruit or bacon Snack:  Second Meal: skip or salad with grilled chicken or cereal and applesauce Snack: grapes or other fruit Third Meal: chicken baked or beef or pork, potato or rice, vegetable Snack: grapes or other fruit Beverages: water, water with flavorings  Alcoholic beverages per week: 0 (socially a couple of times a year)   Estimated Energy Needs Calories: 1500  NUTRITION DIAGNOSIS  Overweight/obesity ([place]-3.3) related to past poor dietary habits and physical  inactivity as evidenced by patient w/ planned RYGB surgery following dietary guidelines for continued weight loss.   NUTRITION INTERVENTION  Nutrition counseling (C-1) and education (E-2) to facilitate bariatric surgery goals.  Pre-Op Goals Reviewed with the Patient Nutrition: Healthy Eating Behaviors Switch to non-caloric, non-carbonated and non-caffeinated beverages such as  water, unsweetened tea, Crystal Light and zero calorie beverages (aim for 64 oz. per day) Cut out grazing between meals or at night  Find a protein shake you like Eat every 3-5 hours        Eliminate distractions while eating (TV, computer, reading, driving, texting) Take 16-10 minutes to eat a meal  Decrease high sugar foods/decrease high fat/fried foods Eliminate alcoholic beverages Increase protein intake (eggs, fish, chicken, yogurt) before surgery Eat non starchy vegetables 2 times a day 7 days a week Eat complex carbohydrates such as whole grains and fruits   Behavioral Modification: Physical Activity Increase my usual daily activity (use stairs, park farther, etc.) Engage in _______________________  activity  _______ minutes ______ times per week  Other:    _________________________________________________________________       Pre-Op Goals Progress & New Goals New: Track fluids; aim for 64 oz of fluids Continue: Eat every 3-5 hours Continue: Eat non starchy vegetables 2 times a day 7 days a week  Handouts Provided Include    Learning Style & Readiness for Change Teaching method utilized: Visual & Auditory  Demonstrated degree of understanding via: Teach Back  Readiness Level: preparation Barriers to learning/adherence to lifestyle change: nothing identified  RD's Notes for next Visit  Patient progress toward chosen goals.    MONITORING &  EVALUATION Dietary intake, weekly physical activity, body weight, and pre-op goals in 1 month.   Next Steps  Patient is to return to [facility] in 2 weeks  for next SWL visit.

## 2023-12-18 ENCOUNTER — Ambulatory Visit (HOSPITAL_COMMUNITY): Payer: Self-pay | Admitting: Licensed Clinical Social Worker

## 2023-12-25 ENCOUNTER — Ambulatory Visit (HOSPITAL_COMMUNITY): Payer: Self-pay | Admitting: Licensed Clinical Social Worker

## 2023-12-25 DIAGNOSIS — F4323 Adjustment disorder with mixed anxiety and depressed mood: Secondary | ICD-10-CM

## 2023-12-25 NOTE — Progress Notes (Signed)
 1
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
## 2024-01-01 ENCOUNTER — Encounter: Payer: Self-pay | Admitting: Dietician

## 2024-01-01 ENCOUNTER — Encounter: Attending: Surgery | Admitting: Dietician

## 2024-01-01 VITALS — Ht 63.0 in | Wt 254.1 lb

## 2024-01-01 DIAGNOSIS — E669 Obesity, unspecified: Secondary | ICD-10-CM | POA: Diagnosis present

## 2024-01-01 NOTE — Progress Notes (Signed)
 Supervised Weight Loss Visit Bariatric Nutrition Education Appt Start Time: 1159    End Time:  1225  Planned surgery: RYGB Pt expectation of surgery: no set number; healthy weight  Referral stated Supervised Weight Loss (SWL) visits needed: 12  11 out of 12 SWL Appointments   NUTRITION ASSESSMENT   Anthropometrics  Start weight at [facility]: 256.8 lbs (date: 12/03/2023)  Height: 63 in Weight today: 254.1 lbs BMI: 45.01 kg/m2     Clinical  Medical hx: hypercholesterolemia, asthma, HTN, obesity Medications: Qulipta for migraines, albuterol , Advair, and vitamin D3, valsartan, metoprolol, amlodipine, and hydrochlorothiazide , Ozempic, asthma, trazodone (insomnia) Labs: no recent labs in EMR Notable signs/symptoms: none noted Any previous deficiencies? No  Lifestyle & Dietary Hx  Still no labs showing in EMR, pt stating she went on Friday April 11th Pt states she is up to 56 oz per day, stating she struggles with it, but it trying. Pt states she is trying to increase plain water. Pt states she was sick last week, stating she did not eat some meals. Pt states she is doing better not skipping meals. Pt states she has been focusing on protein more, stating she is getting low sugar cereals with protein, and making ranch dip with greek yogurt or cottage cheese. Pt agreeable to track protein and increase physical activity, stating she can start both as new goals and it will be doable.  Estimated daily fluid intake: not sure, but about 56 oz Supplements: vit D, iron (65 mg every other day) Current average weekly physical activity: walking 2-3 days a week, for 20-30 minutes  24-Hr Dietary Recall First Meal: applesauce or skip or protein shake or bagel w/ cream cheese and fruit or bacon Snack:  Second Meal: skip or salad with grilled chicken or cereal and applesauce Snack: grapes or other fruit Third Meal: chicken baked or beef or pork, potato or rice, vegetable Snack: grapes or other  fruit Beverages: water, water with flavorings  Alcoholic beverages per week: 0 (socially a couple of times a year)   Estimated Energy Needs Calories: 1500  NUTRITION DIAGNOSIS  Overweight/obesity ([place]-3.3) related to past poor dietary habits and physical inactivity as evidenced by patient w/ planned RYGB surgery following dietary guidelines for continued weight loss.  NUTRITION INTERVENTION  Nutrition counseling (C-1) and education (E-2) to facilitate bariatric surgery goals.  Encouraged pt to continue to eat balanced meals inclusive of non starchy vegetables 2 times a day 7 days a week Encouraged pt to continue to drink a minium 64 fluid ounces with half being plain water to satisfy proper hydration   Aim for at least 150 minutes of moderate physical activity, including a mix of cardio and resistance training.  Pre-Op Goals Reviewed with the Patient  Pre-Op Goals Progress & New Goals Continue: Track fluids; aim for 64 oz of fluids Continue: Eat every 3-5 hours Continue: Eat non starchy vegetables 2 times a day 7 days a week New: increase physical activity; aim for 5 days per week for 20-30 minutes New: track protein; aim for 60 grams per day; aim protein with every meal or snack  Handouts Provided Include  Bariatric MyPlate Meal Ideas  Learning Style & Readiness for Change Teaching method utilized: Visual & Auditory  Demonstrated degree of understanding via: Teach Back  Readiness Level: preparation Barriers to learning/adherence to lifestyle change: nothing identified  RD's Notes for next Visit  Patient progress toward chosen goals.   MONITORING & EVALUATION Dietary intake, weekly physical activity, body weight, and pre-op  goals in 1 month.   Next Steps  Patient is to return to [facility] in 2 weeks for next SWL visit.

## 2024-01-15 ENCOUNTER — Encounter: Payer: Self-pay | Admitting: Dietician

## 2024-01-15 ENCOUNTER — Encounter: Attending: Surgery | Admitting: Dietician

## 2024-01-15 VITALS — Ht 63.0 in | Wt 253.5 lb

## 2024-01-15 DIAGNOSIS — E669 Obesity, unspecified: Secondary | ICD-10-CM | POA: Diagnosis present

## 2024-01-15 NOTE — Progress Notes (Signed)
 Supervised Weight Loss Visit Bariatric Nutrition Education Appt Start Time: 1159    End Time:  1225  Planned surgery: RYGB Pt expectation of surgery: no set number; healthy weight  Referral stated Supervised Weight Loss (SWL) visits needed: 12  12 out of 12 SWL Appointments   Pt completed visits.   Pt has cleared nutrition requirements.   NUTRITION ASSESSMENT   Anthropometrics  Start weight at [facility]: 256.8 lbs (date: 12/03/2023)  Height: 63 in Weight today: 253.5 lbs BMI: 44.91 kg/m2     Clinical  Medical hx: hypercholesterolemia, asthma, HTN, obesity Medications: Qulipta for migraines, albuterol , Advair, and vitamin D3, valsartan, metoprolol, amlodipine, and hydrochlorothiazide , Ozempic, asthma, trazodone (insomnia) Labs: A1c 6.1; iron saturation 9; triglycerides 336; HDL 30; VLDL 55; glucose 108 (fasting per pt); hemoglobin 10.9; hematocrit 33.5 Notable signs/symptoms: none noted Any previous deficiencies? No  Lifestyle & Dietary Hx  Pt states she is still at about 56 oz per day, stating it has been harder to get physical activity. Pt states she is on her 3rd round of prednisone, stating she has bronchitis. Pt states she is aware of what she is snacking on and mindful of choices at work and at [employer]. Pt states she is still trying to get to 64 oz of fluids, and eager to increase physical activity after she gets over bronchitis. Pt states she has increased protein intake, stating she will focus on tracking more before surgery, to help make it a habit. Pt states this process has helped her be more open to try other foods.  Estimated daily fluid intake: not sure, but about 56 oz Supplements: vit D, iron (65 mg every other day) Current average weekly physical activity: walking 2-3 days a week, for 20-30 minutes  24-Hr Dietary Recall First Meal: applesauce or skip or protein shake or bagel w/ cream cheese and fruit or bacon Snack:  Second Meal: skip or salad with grilled  chicken or cereal and applesauce Snack: grapes or other fruit Third Meal: chicken baked or beef or pork, potato or rice, vegetable Snack: grapes or other fruit Beverages: water, water with flavorings  Alcoholic beverages per week: 0 (socially a couple of times a year)   Estimated Energy Needs Calories: 1500  NUTRITION DIAGNOSIS  Overweight/obesity ([place]-3.3) related to past poor dietary habits and physical inactivity as evidenced by patient w/ planned RYGB surgery following dietary guidelines for continued weight loss.  NUTRITION INTERVENTION  Nutrition counseling (C-1) and education (E-2) to facilitate bariatric surgery goals.  Encouraged pt to continue to eat balanced meals inclusive of non starchy vegetables 2 times a day 7 days a week Encouraged pt to continue to drink a minium 64 fluid ounces with half being plain water to satisfy proper hydration   Aim for at least 150 minutes of moderate physical activity, including a mix of cardio and resistance training.  Pre-Op Goals Reviewed with the Patient  Pre-Op Goals Progress & New Goals Continue: Track fluids; aim for 64 oz of fluids Continue: Eat every 3-5 hours Continue: Eat non starchy vegetables 2 times a day 7 days a week Continue: increase physical activity; aim for 5 days per week for 20-30 minutes Continue: track protein; aim for 60 grams per day; aim protein with every meal or snack  Handouts Provided Include    Learning Style & Readiness for Change Teaching method utilized: Visual & Auditory  Demonstrated degree of understanding via: Teach Back  Readiness Level: preparation Barriers to learning/adherence to lifestyle change: nothing identified  RD's  Notes for next Visit  Patient progress toward chosen goals.   MONITORING & EVALUATION Dietary intake, weekly physical activity, body weight, and pre-op goals in 1 month.   Next Steps  Pt has completed visits. No further supervised visits required/recommended. Patient  is to return to [facility] for pre-op class >2 weeks prior to scheduled surgery.

## 2024-01-24 ENCOUNTER — Ambulatory Visit: Payer: Self-pay

## 2024-01-24 NOTE — Telephone Encounter (Addendum)
 Chief Complaint: New Pt/cough Symptoms: dry cough, wheezing Frequency: x 1-2 months Pertinent Negatives: Patient denies fever, URI sx, COVID neg, CP, SOB Disposition: [] ED /[] Urgent Care (no appt availability in office) / [x] Appointment(In office/virtual)/ []  [facility]/ [] Home Care/ [] Refused Recommended Disposition /[] [facility]/ []  Follow-up with PCP Additional Notes: Pt c/o chronic dry cough, wheezing x 1-2 months. Pt has hx of asthma and has been trying to tx sx with PCP, but sx are not improving despite tx. Pt reports has been using OTC medications (Affrin, Mucinex, INH/NEB) with minimal/no relief of sx. Additionally, pt has gone through a few rounds of steroids, and abx with no improvement. [facility] does not appreciate audible SOB/wheezing during call. Pt is speaking in full sentences. Pt's PCP reported to send referral to [facility] a few days ago, but referral not in Epic, so pt is requesting self-referral. [facility] scheduled earliest appt available. [facility] also reinforced pt to follow PCP recs until she can be established with [facility]. Patient verbalized understanding and to call back/911 with worsening symptoms.    Copied from CRM [id number]. Topic: Clinical - Red Word Triage >> Jan 24, 2024  2:36 PM Crist Dominion wrote: Red Word that prompted transfer to Nurse Triage:  Trouble breathing. Reason for Disposition  [1] MILD longstanding difficulty breathing AND [2]  SAME as normal  Answer Assessment - Initial Assessment Questions 1. RESPIRATORY STATUS: "Describe your breathing?" (e.g., wheezing, shortness of breath, unable to speak, severe coughing)      Hx of asthma Started with congested cough and wheezing. Pt took NEB/INH x 2 weeks without relief and followed PCP x 2-3, UC x 2 for sx UC CXR reports negative Reports tried Affrin, mucinex per PCP recs with no relief, Pt reports finishing prednisone and z-pack with no relief 2. ONSET: "When did this breathing problem begin?"       X 1-2 month 3. PATTERN "Does the difficult breathing come and go, or has it been constant since it started?"      constant 4. SEVERITY: "How bad is your breathing?" (e.g., mild, moderate, severe)    - MILD: No SOB at rest, mild SOB with walking, speaks normally in sentences, can lie down, no retractions, pulse < 100.    - MODERATE: SOB at rest, SOB with minimal exertion and prefers to sit, cannot lie down flat, speaks in phrases, mild retractions, audible wheezing, pulse 100-120.    - SEVERE: Very SOB at rest, speaks in single words, struggling to breathe, sitting hunched forward, retractions, pulse > 120      Mild-moderate: sx at rest as well 5. RECURRENT SYMPTOM: "Have you had difficulty breathing before?" If Yes, ask: "When was the last time?" and "What happened that time?"      First time 6. CARDIAC HISTORY: "Do you have any history of heart disease?" (e.g., heart attack, angina, bypass surgery, angioplasty)      Denies Pt endorses hx of HTN 7. LUNG HISTORY: "Do you have any history of lung disease?"  (e.g., pulmonary embolus, asthma, emphysema)     Asthma 8. CAUSE: "What do you think is causing the breathing problem?"      unknown 9. OTHER SYMPTOMS: "Do you have any other symptoms? (e.g., dizziness, runny nose, cough, chest pain, fever)     Cough, chest congestion Denies other sx 10. O2 SATURATION MONITOR:  "Do you use an oxygen saturation monitor (pulse oximeter) at home?" If Yes, ask: "What is your reading (oxygen level) today?" "What is your  usual oxygen saturation reading?" (e.g., 95%)       Pt reports SpO2 has been good in office 11. PREGNANCY: "Is there any chance you are pregnant?" "When was your last menstrual period?"       Denies, LMP 5/6 12. TRAVEL: "Have you traveled out of the country in the last month?" (e.g., travel history, exposures)       denies  Protocols used: Breathing Difficulty-A-AH

## 2024-03-04 ENCOUNTER — Ambulatory Visit: Payer: Self-pay | Admitting: Surgery

## 2024-03-04 NOTE — H&P (Signed)
 Amy Ferguson [id number]   Referring Provider: Prentiss Geni Fuller, DO  Subjective   Chief Complaint: New Consultation Ona Look)   History of Present Illness:  UGI: IMPRESSION: 1. 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 Spontaneous mild gastroesophageal reflux. 2. No hiatal hernia or gastric abnormality. 3. Persistent area of underdistention at the gastroesophageal junction, for which mild peptic induced stricture cannot be excluded.  Dietician- cleared  Very pleasant 43 year old woman with history of hypertension (current medications include valsartan, metoprolol, amlodipine, and HCTZ), prediabetes (currently on Ozempic), asthma, insomnia (trazodone) and morbid obesity who presents for consultation regarding the latter. Of note, she is additionally taking Qulipta for migraines, albuterol , Advair, and vitamin D3. She has followed with Dr. Prentiss for almost a year now, and last was evaluated by Dr. Prentiss at the end of February with plans for 4-week follow-up. Since beginning treatment there in May 2024 she has lost about 9 pounds, but recently feels that her weight has not really budged despite increasing her efforts. She has tolerated the Ozempic fairly well but has not seen significant weight loss with it. Current interventions include food journaling, walking for 30 minutes twice a week. She is mindful of snacking patterns and has cut back on eating out for lunch while she is at work with her coworkers. Previous weight loss attempts have included exercise programs, medication, multiple efforts at each of weight watchers, Nutrisystem's, Slim fast, protein shakes, calorie restriction, carbohydrate restriction, and fasting. Weight loss ranges from 0 to 20 pounds and regains most or all of this with each attempt. She is interested in gastric bypass. She notes that her weight inhibits her ability to keep up with her son, affects her mental health and social life. Her  goal is to get to a point where she is able to be active with her son who is now 19 years old.  She does have a family history of obesity, hypertension and and diabetes. Of note, her twin sister Amy Ferguson is also a patient of mine.  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   Review of Systems: A complete review of systems was obtained from the patient. I have reviewed this information and discussed as appropriate with the patient. See HPI as well for other ROS.  Medical History: Past Medical History:  Diagnosis Date  Anemia  Anxiety  Asthma, unspecified asthma severity, unspecified whether complicated, unspecified whether persistent (HHS-HCC)  Hyperlipidemia  Hypertension   There is no problem list on file for this patient.  Past Surgical History:  Procedure Laterality Date  TONSILLECTOMY & ADENOIDECTOMY 1997    Allergies  Allergen Reactions  Labetalol  Headache  Latex Rash   Current Outpatient Medications on File Prior to Visit  Medication Sig Dispense Refill  ADVAIR DISKUS 250-50 mcg/dose diskus inhaler 1 Puff  albuterol  (ACCUNEB ) 1.25 mg/3 mL nebulizer solution  albuterol  MDI, PROVENTIL , VENTOLIN , PROAIR , HFA 90 mcg/actuation inhaler Inhale 2 inhalations into the lungs every 6 (six) hours as needed  amLODIPine (NORVASC) 5 MG tablet 1 tablet Orally Once a day  cholecalciferol (VITAMIN D3) 1,250 mcg (50,000 unit) capsule 1 capsule  hydroCHLOROthiazide  (HYDRODIURIL ) 25 MG tablet 1 tablet in the morning Orally Once a day  metoprolol SUCCinate 50 mg CSpX  QULIPTA 60 mg Tab 1 tablet Orally Once a day for 90 days  semaglutide (OZEMPIC) 0.25 mg or 0.5 mg (2 mg/3 mL) pen injector Inject 0.5mg  under the skin once  weekly.  traZODone (DESYREL) 50 MG tablet Take 1 tablet by mouth once daily as needed at bedtime for sleep  valsartan (DIOVAN) 320 MG tablet Take 1 tablet by mouth once daily   No current  facility-administered medications on file prior to visit.   Family History  Problem Relation Age of Onset  Obesity Mother  High blood pressure (Hypertension) Mother  Diabetes Mother  Obesity Father  High blood pressure (Hypertension) Father  Diabetes Father  Obesity Sister  High blood pressure (Hypertension) Sister  Diabetes Sister    Social History   Tobacco Use  Smoking Status Never  Smokeless Tobacco Never    Social History   Socioeconomic History  Marital status: Single  Tobacco Use  Smoking status: Never  Smokeless tobacco: Never  Vaping Use  Vaping status: Never Used  Substance and Sexual Activity  Alcohol use: Yes  Drug use: Never   Objective:   Vitals:  11/22/23 0920 11/22/23 0921  BP: (!) 167/129  Pulse: 103  Temp: 36.8 C (98.3 F)  SpO2: 97%  Weight: (!) 116.8 kg (257 lb 6.4 oz)  Height: 163.8 cm (5' 4.5)  PainSc: 0-No pain   Body mass index is 43.5 kg/m.  Gen: A&Ox3, no distress  Unlabored respirations  Assessment and Plan:  Diagnoses and all orders for this visit:  Morbid obesity (CMS/HHS-HCC) Comments: CW: 257.4 BMI 30: 175lb Orders: - CBC with Diff, Platelet, NLR - [facility]; Future - Basic Metabolic Panel (BMP); Future - Lipid Panel - [facility]; Future - T4+Free T4 - [facility]; Future - Thyroid Stimulating Hormone (TSH); Future - H. pylori Breath Test - [facility]; Future - Iron and Total Iron Binding Capacity (TIBC); Future - Vitamin B12 - [facility]; Future - Folate; Future - Hemoglobin A1C; Future - Prothrombin Time (INR); Future - Vitamin D, 25-Hydroxy - [facility]; Future - X-ray upper GI - ECG 12-lead - Ambulatory Referral to Nutrition - X-ray chest PA and lateral - Ambulatory Referral to Adult Behavioral Health  Asthma, unspecified asthma severity, unspecified whether complicated, unspecified whether persistent (HHS-HCC)  Essential hypertension  Prediabetes  Other migraine without status migrainosus, not intractable   I  think she is an excellent candidate for gastric bypass. We discussed the surgery including technical aspects, the risks of bleeding, infection, pain, scarring, injury to intra-abdominal structures, staple line/anastomotic leak or abscess, chronic abdominal pain or nausea, new onset or worsened GERD in the case of sleeve, marginal ulcer, internal hernia, malabsorption/malnutrition and vitamin/nutrient deficiencies in the case of bypass, DVT/PE, pneumonia, heart attack, stroke, death, failure to reach weight loss goals and weight regain, hernia. Discussed the typical pre-, peri-, and postoperative course. Discussed the importance of lifelong behavioral changes to combat the chronic and relapsing disease which is obesity.   Cami Delawder ALAN FREUND, MD

## 2024-03-04 NOTE — H&P (View-Only) (Signed)
 Amy Ferguson [id number]   Referring Provider: Prentiss Geni Fuller, DO  Subjective   Chief Complaint: New Consultation Ona Look)   History of Present Illness:  UGI: IMPRESSION: 1. 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 Spontaneous mild gastroesophageal reflux. 2. No hiatal hernia or gastric abnormality. 3. Persistent area of underdistention at the gastroesophageal junction, for which mild peptic induced stricture cannot be excluded.  Dietician- cleared  Very pleasant 43 year old woman with history of hypertension (current medications include valsartan, metoprolol, amlodipine, and HCTZ), prediabetes (currently on Ozempic), asthma, insomnia (trazodone) and morbid obesity who presents for consultation regarding the latter. Of note, she is additionally taking Qulipta for migraines, albuterol , Advair, and vitamin D3. She has followed with Dr. Prentiss for almost a year now, and last was evaluated by Dr. Prentiss at the end of February with plans for 4-week follow-up. Since beginning treatment there in May 2024 she has lost about 9 pounds, but recently feels that her weight has not really budged despite increasing her efforts. She has tolerated the Ozempic fairly well but has not seen significant weight loss with it. Current interventions include food journaling, walking for 30 minutes twice a week. She is mindful of snacking patterns and has cut back on eating out for lunch while she is at work with her coworkers. Previous weight loss attempts have included exercise programs, medication, multiple efforts at each of weight watchers, Nutrisystem's, Slim fast, protein shakes, calorie restriction, carbohydrate restriction, and fasting. Weight loss ranges from 0 to 20 pounds and regains most or all of this with each attempt. She is interested in gastric bypass. She notes that her weight inhibits her ability to keep up with her son, affects her mental health and social life. Her  goal is to get to a point where she is able to be active with her son who is now 19 years old.  She does have a family history of obesity, hypertension and and diabetes. Of note, her twin sister [name] is also a patient of mine.  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   Review of Systems: A complete review of systems was obtained from the patient. I have reviewed this information and discussed as appropriate with the patient. See HPI as well for other ROS.  Medical History: Past Medical History:  Diagnosis Date  Anemia  Anxiety  Asthma, unspecified asthma severity, unspecified whether complicated, unspecified whether persistent (HHS-HCC)  Hyperlipidemia  Hypertension   There is no problem list on file for this patient.  Past Surgical History:  Procedure Laterality Date  TONSILLECTOMY & ADENOIDECTOMY 1997    Allergies  Allergen Reactions  Labetalol  Headache  Latex Rash   Current Outpatient Medications on File Prior to Visit  Medication Sig Dispense Refill  ADVAIR DISKUS 250-50 mcg/dose diskus inhaler 1 Puff  albuterol  (ACCUNEB ) 1.25 mg/3 mL nebulizer solution  albuterol  MDI, PROVENTIL , VENTOLIN , PROAIR , HFA 90 mcg/actuation inhaler Inhale 2 inhalations into the lungs every 6 (six) hours as needed  amLODIPine (NORVASC) 5 MG tablet 1 tablet Orally Once a day  cholecalciferol (VITAMIN D3) 1,250 mcg (50,000 unit) capsule 1 capsule  hydroCHLOROthiazide  (HYDRODIURIL ) 25 MG tablet 1 tablet in the morning Orally Once a day  metoprolol SUCCinate 50 mg CSpX  QULIPTA 60 mg Tab 1 tablet Orally Once a day for 90 days  semaglutide (OZEMPIC) 0.25 mg or 0.5 mg (2 mg/3 mL) pen injector Inject 0.5mg  under the skin once  weekly.  traZODone (DESYREL) 50 MG tablet Take 1 tablet by mouth once daily as needed at bedtime for sleep  valsartan (DIOVAN) 320 MG tablet Take 1 tablet by mouth once daily   No current  facility-administered medications on file prior to visit.   Family History  Problem Relation Age of Onset  Obesity Mother  High blood pressure (Hypertension) Mother  Diabetes Mother  Obesity Father  High blood pressure (Hypertension) Father  Diabetes Father  Obesity Sister  High blood pressure (Hypertension) Sister  Diabetes Sister    Social History   Tobacco Use  Smoking Status Never  Smokeless Tobacco Never    Social History   Socioeconomic History  Marital status: Single  Tobacco Use  Smoking status: Never  Smokeless tobacco: Never  Vaping Use  Vaping status: Never Used  Substance and Sexual Activity  Alcohol use: Yes  Drug use: Never   Objective:   Vitals:  11/22/23 0920 11/22/23 0921  BP: (!) 167/129  Pulse: 103  Temp: 36.8 C (98.3 F)  SpO2: 97%  Weight: (!) 116.8 kg (257 lb 6.4 oz)  Height: 163.8 cm (5' 4.5)  PainSc: 0-No pain   Body mass index is 43.5 kg/m.  Gen: A&Ox3, no distress  Unlabored respirations  Assessment and Plan:  Diagnoses and all orders for this visit:  Morbid obesity (CMS/HHS-HCC) Comments: CW: 257.4 BMI 30: 175lb Orders: - CBC with Diff, Platelet, NLR - [facility]; Future - Basic Metabolic Panel (BMP); Future - Lipid Panel - [facility]; Future - T4+Free T4 - [facility]; Future - Thyroid Stimulating Hormone (TSH); Future - H. pylori Breath Test - [facility]; Future - Iron and Total Iron Binding Capacity (TIBC); Future - Vitamin B12 - [facility]; Future - Folate; Future - Hemoglobin A1C; Future - Prothrombin Time (INR); Future - Vitamin D, 25-Hydroxy - [facility]; Future - X-ray upper GI - ECG 12-lead - Ambulatory Referral to Nutrition - X-ray chest PA and lateral - Ambulatory Referral to Adult Behavioral Health  Asthma, unspecified asthma severity, unspecified whether complicated, unspecified whether persistent (HHS-HCC)  Essential hypertension  Prediabetes  Other migraine without status migrainosus, not intractable   I  think she is an excellent candidate for gastric bypass. We discussed the surgery including technical aspects, the risks of bleeding, infection, pain, scarring, injury to intra-abdominal structures, staple line/anastomotic leak or abscess, chronic abdominal pain or nausea, new onset or worsened GERD in the case of sleeve, marginal ulcer, internal hernia, malabsorption/malnutrition and vitamin/nutrient deficiencies in the case of bypass, DVT/PE, pneumonia, heart attack, stroke, death, failure to reach weight loss goals and weight regain, hernia. Discussed the typical pre-, peri-, and postoperative course. Discussed the importance of lifelong behavioral changes to combat the chronic and relapsing disease which is obesity.   Cami Delawder ALAN FREUND, MD

## 2024-03-10 ENCOUNTER — Encounter: Payer: Self-pay | Admitting: Skilled Nursing Facility1

## 2024-03-10 ENCOUNTER — Encounter: Attending: Surgery | Admitting: Skilled Nursing Facility1

## 2024-03-10 NOTE — Progress Notes (Signed)
 Pre-Operative Nutrition Class:    Patient was seen on 03/10/2024 for Pre-Operative Bariatric Surgery Education at [facility]    Surgery date: 03/25/2024 Surgery type: RYGB Start weight at [facility]: 256.8 Weight today: 258.9  Samples given per MNT protocol. Patient educated on appropriate usage: Ensure Max Lot # [id number] Exp: 11/02/2024   Celebrate multi Lot # [id number] Exp: 01/26  The following the learning objectives were met by the patient during this course: Identify Pre-Op Dietary Goals and will begin 2 weeks pre-operatively Identify appropriate sources of fluids and proteins  State protein recommendations and appropriate sources pre and post-operatively Identify Post-Operative Dietary Goals and will follow for 2 weeks post-operatively Identify appropriate multivitamin and calcium sources Describe the need for physical activity post-operatively and will follow MD recommendations State when to call healthcare provider regarding medication questions or post-operative complications When having a diagnosis of diabetes understanding hypoglycemia symptoms and the inclusion of 1 complex carbohydrate per meal  Handouts given during class include: Pre-Op Bariatric Surgery Diet Handout Protein Shake Handout Post-Op Bariatric Surgery Nutrition Handout BELT Program Information Flyer Support Group Information Flyer [facility] Bariatric Supplements Price List  Follow-Up Plan: Patient will follow-up at [facility] 2 weeks post operatively for diet advancement per MD.

## 2024-03-17 ENCOUNTER — Ambulatory Visit

## 2024-03-18 NOTE — Patient Instructions (Signed)
 SURGICAL WAITING ROOM VISITATION  Patients having surgery or a procedure may have no more than 2 support people in the waiting area - these visitors may rotate.    Children under the age of 31 must have an adult with them who is not the patient.  Visitors with respiratory illnesses are discouraged from visiting and should remain at home.  If the patient needs to stay at the hospital during part of their recovery, the visitor guidelines for inpatient rooms apply. Pre-op nurse will coordinate an appropriate time for 1 support person to accompany patient in pre-op.  This support person may not rotate.    Please refer to the [facility] website for the visitor guidelines for Inpatients (after your surgery is over and you are in a regular room).    Your procedure is scheduled on: 03/25/24   Report to [facility] Main Entrance    Report to admitting at 7:45 AM   Call this number if you have problems the morning of surgery [phone]   MORNING OF SURGERY DRINK:   DRINK 1 G2 drink BEFORE YOU LEAVE HOME, DRINK ALL OF THE  G2 DRINK AT ONE TIME.   NO SOLID FOOD AFTER 600 PM THE NIGHT BEFORE YOUR SURGERY. YOU MAY DRINK CLEAR FLUIDS. THE G2 DRINK YOU DRINK BEFORE YOU LEAVE HOME WILL BE THE LAST FLUIDS YOU DRINK BEFORE SURGERY.  PAIN IS EXPECTED AFTER SURGERY AND WILL NOT BE COMPLETELY ELIMINATED. AMBULATION AND TYLENOL  WILL HELP REDUCE INCISIONAL AND GAS PAIN. MOVEMENT IS KEY!  YOU ARE EXPECTED TO BE OUT OF BED WITHIN 4 HOURS OF ADMISSION TO YOUR PATIENT ROOM.  SITTING IN THE RECLINER THROUGHOUT THE DAY IS IMPORTANT FOR DRINKING FLUIDS AND MOVING GAS THROUGHOUT THE GI TRACT.  COMPRESSION STOCKINGS SHOULD BE WORN [facility] STAY UNLESS YOU ARE WALKING.   INCENTIVE SPIROMETER SHOULD BE USED EVERY HOUR WHILE AWAKE TO DECREASE POST-OPERATIVE COMPLICATIONS SUCH AS PNEUMONIA.  WHEN DISCHARGED HOME, IT IS IMPORTANT TO CONTINUE TO WALK EVERY HOUR AND USE THE INCENTIVE SPIROMETER  EVERY HOUR.    You may have the following liquids until 7:00 AM DAY OF SURGERY  Water Non-Citrus Juices (without pulp, NO RED-Apple, White grape, White cranberry) Black Coffee (NO MILK/CREAM OR CREAMERS, sugar ok)  Clear Tea (NO MILK/CREAM OR CREAMERS, sugar ok) regular and decaf                             Plain Jell-O (NO RED)                                           Fruit ices (not with fruit pulp, NO RED)                                     Popsicles (NO RED)                                                               Sports drinks like Gatorade (NO RED)  The day of surgery:  Drink ONE (1) Pre-Surgery G2 at 7:00 AM the morning of surgery. Drink in one sitting. Do not sip.  This drink was given to you during your hospital  pre-op appointment visit. Nothing else to drink after completing the  Pre-Surgery G2.          If you have questions, please contact your surgeon's office.   FOLLOW BOWEL PREP AND ANY ADDITIONAL PRE OP INSTRUCTIONS YOU RECEIVED FROM YOUR SURGEON'S OFFICE!!!     Oral Hygiene is also important to reduce your risk of infection.                                    Remember - BRUSH YOUR TEETH THE MORNING OF SURGERY WITH YOUR REGULAR TOOTHPASTE  DENTURES WILL BE REMOVED PRIOR TO SURGERY PLEASE DO NOT APPLY Poly grip OR ADHESIVES!!!   Stop all vitamins and herbal supplements 7 days before surgery.   Hold Ozempic for 7 days before surgery. Do not take after 03/17/24.   Take these medicines the morning of surgery with A SIP OF WATER: Inhalers, Amlodipine, Metoprolol              You may not have any metal on your body including hair pins, jewelry, and body piercing             Do not wear make-up, lotions, powders, perfumes, or deodorant  Do not wear nail polish including gel and S&S, artificial/acrylic nails, or any other type of covering on natural nails including finger and toenails. If you have artificial nails, gel coating, etc. that needs to  be removed by a nail salon please have this removed prior to surgery or surgery may need to be canceled/ delayed if the surgeon/ anesthesia feels like they are unable to be safely monitored.   Do not shave  48 hours prior to surgery.    Do not bring valuables to the hospital. [facility] IS NOT             RESPONSIBLE   FOR VALUABLES.   Contacts, glasses, dentures or bridgework may not be worn into surgery.   Bring small overnight bag day of surgery.   DO NOT BRING YOUR HOME MEDICATIONS TO THE HOSPITAL. PHARMACY WILL DISPENSE MEDICATIONS LISTED ON YOUR MEDICATION LIST TO YOU DURING YOUR ADMISSION IN THE HOSPITAL!   Special Instructions: Bring a copy of your healthcare power of attorney and living will documents the day of surgery if you haven't scanned them before.              Please read over the following fact sheets you were given: IF YOU HAVE QUESTIONS ABOUT YOUR PRE-OP INSTRUCTIONS PLEASE CALL [phone]    If you received a COVID test during your pre-op visit  it is requested that you wear a mask when out in public, stay away from anyone that may not be feeling well and notify your surgeon if you develop symptoms. If you test positive for Covid or have been in contact with anyone that has tested positive in the last 10 days please notify you surgeon.    [facility] - Preparing for Surgery Before surgery, you can play an important role.  Because skin is not sterile, your skin needs to be as free of germs as possible.  You can reduce the number of germs on your skin by washing with CHG (chlorahexidine  gluconate) soap before surgery.  CHG is an antiseptic cleaner which kills germs and bonds with the skin to continue killing germs even after washing. Please DO NOT use if you have an allergy to CHG or antibacterial soaps.  If your skin becomes reddened/irritated stop using the CHG and inform your nurse when you arrive at Short Stay. Do not shave (including legs and underarms) for  at least 48 hours prior to the first CHG shower.  You may shave your face/neck.  Please follow these instructions carefully:  1.  Shower with CHG Soap the night before surgery and the  morning of surgery.  2.  If you choose to wash your hair, wash your hair first as usual with your normal  shampoo.  3.  After you shampoo, rinse your hair and body thoroughly to remove the shampoo.                             4.  Use CHG as you would any other liquid soap.  You can apply chg directly to the skin and wash.  Gently with a scrungie or clean washcloth.  5.  Apply the CHG Soap to your body ONLY FROM THE NECK DOWN.   Do   not use on face/ open                           Wound or open sores. Avoid contact with eyes, ears mouth and   genitals (private parts).                       Wash face,  Genitals (private parts) with your normal soap.             6.  Wash thoroughly, paying special attention to the area where your    surgery  will be performed.  7.  Thoroughly rinse your body with warm water from the neck down.  8.  DO NOT shower/wash with your normal soap after using and rinsing off the CHG Soap.                9.  Pat yourself dry with a clean towel.            10.  Wear clean pajamas.            11.  Place clean sheets on your bed the night of your first shower and do not  sleep with pets. Day of Surgery : Do not apply any lotions/deodorants the morning of surgery.  Please wear clean clothes to the hospital/surgery center.  FAILURE TO FOLLOW THESE INSTRUCTIONS MAY RESULT IN THE CANCELLATION OF YOUR SURGERY  PATIENT SIGNATURE_________________________________  NURSE SIGNATURE__________________________________  ________________________________________________________________________  Amy Ferguson  An incentive spirometer is a tool that can help keep your lungs clear and active. This tool measures how well you are filling your lungs with each breath. Taking long deep breaths may help  reverse or decrease the chance of developing breathing (pulmonary) problems (especially infection) following: A long period of time when you are unable to move or be active. BEFORE THE PROCEDURE  If the spirometer includes an indicator to show your best effort, your nurse or respiratory therapist will set it to a desired goal. If possible, sit up straight or lean slightly forward. Try not to slouch. Hold the incentive spirometer in an upright position. INSTRUCTIONS FOR USE  Sit  on the edge of your bed if possible, or sit up as far as you can in bed or on a chair. Hold the incentive spirometer in an upright position. Breathe out normally. Place the mouthpiece in your mouth and seal your lips tightly around it. Breathe in slowly and as deeply as possible, raising the piston or the ball toward the top of the column. Hold your breath for 3-5 seconds or for as long as possible. Allow the piston or ball to fall to the bottom of the column. Remove the mouthpiece from your mouth and breathe out normally. Rest for a few seconds and repeat Steps 1 through 7 at least 10 times every 1-2 hours when you are awake. Take your time and take a few normal breaths between deep breaths. The spirometer may include an indicator to show your best effort. Use the indicator as a goal to work toward during each repetition. After each set of 10 deep breaths, practice coughing to be sure your lungs are clear. If you have an incision (the cut made at the time of surgery), support your incision when coughing by placing a pillow or rolled up towels firmly against it. Once you are able to get out of bed, walk around indoors and cough well. You may stop using the incentive spirometer when instructed by your caregiver.  RISKS AND COMPLICATIONS Take your time so you do not get dizzy or light-headed. If you are in pain, you may need to take or ask for pain medication before doing incentive spirometry. It is harder to take a deep  breath if you are having pain. AFTER USE Rest and breathe slowly and easily. It can be helpful to keep track of a log of your progress. Your caregiver can provide you with a simple table to help with this. If you are using the spirometer at home, follow these instructions: SEEK MEDICAL CARE IF:  You are having difficultly using the spirometer. You have trouble using the spirometer as often as instructed. Your pain medication is not giving enough relief while using the spirometer. You develop fever of 100.5 F (38.1 C) or higher. SEEK IMMEDIATE MEDICAL CARE IF:  You cough up bloody sputum that had not been present before. You develop fever of 102 F (38.9 C) or greater. You develop worsening pain at or near the incision site. MAKE SURE YOU:  Understand these instructions. Will watch your condition. Will get help right away if you are not doing well or get worse. Document Released: 01/01/2007 Document Revised: 11/13/2011 Document Reviewed: 03/04/2007 ExitCare Patient Information 2014 ExitCare, [place].   ________________________________________________________________________ WHAT IS A BLOOD TRANSFUSION? Blood Transfusion Information  A transfusion is the replacement of blood or some of its parts. Blood is made up of multiple cells which provide different functions. Red blood cells carry oxygen and are used for blood loss replacement. White blood cells fight against infection. Platelets control bleeding. Plasma helps clot blood. Other blood products are available for specialized needs, such as hemophilia or other clotting disorders. BEFORE THE TRANSFUSION  Who gives blood for transfusions?  Healthy volunteers who are fully evaluated to make sure their blood is safe. This is blood bank blood. Transfusion therapy is the safest it has ever been in the practice of medicine. Before blood is taken from a donor, a complete history is taken to make sure that person has no history of diseases  nor engages in risky social behavior (examples are intravenous drug use or sexual activity with multiple  partners). The donor's travel history is screened to minimize risk of transmitting infections, such as malaria. The donated blood is tested for signs of infectious diseases, such as HIV and hepatitis. The blood is then tested to be sure it is compatible with you in order to minimize the chance of a transfusion reaction. If you or a relative donates blood, this is often done in anticipation of surgery and is not appropriate for emergency situations. It takes many days to process the donated blood. RISKS AND COMPLICATIONS Although transfusion therapy is very safe and saves many lives, the main dangers of transfusion include:  Getting an infectious disease. Developing a transfusion reaction. This is an allergic reaction to something in the blood you were given. Every precaution is taken to prevent this. The decision to have a blood transfusion has been considered carefully by your caregiver before blood is given. Blood is not given unless the benefits outweigh the risks. AFTER THE TRANSFUSION Right after receiving a blood transfusion, you will usually feel much better and more energetic. This is especially true if your red blood cells have gotten low (anemic). The transfusion raises the level of the red blood cells which carry oxygen, and this usually causes an energy increase. The nurse administering the transfusion will monitor you carefully for complications. HOME CARE INSTRUCTIONS  No special instructions are needed after a transfusion. You may find your energy is better. Speak with your caregiver about any limitations on activity for underlying diseases you may have. SEEK MEDICAL CARE IF:  Your condition is not improving after your transfusion. You develop redness or irritation at the intravenous (IV) site. SEEK IMMEDIATE MEDICAL CARE IF:  Any of the following symptoms occur over the next 12  hours: Shaking chills. You have a temperature by mouth above 102 F (38.9 C), not controlled by medicine. Chest, back, or muscle pain. People around you feel you are not acting correctly or are confused. Shortness of breath or difficulty breathing. Dizziness and fainting. You get a rash or develop hives. You have a decrease in urine output. Your urine turns a dark color or changes to pink, red, or brown. Any of the following symptoms occur over the next 10 days: You have a temperature by mouth above 102 F (38.9 C), not controlled by medicine. Shortness of breath. Weakness after normal activity. The white part of the eye turns yellow (jaundice). You have a decrease in the amount of urine or are urinating less often. Your urine turns a dark color or changes to pink, red, or brown. Document Released: 08/18/2000 Document Revised: 11/13/2011 Document Reviewed: 04/06/2008 [facility] Patient Information 2014 [place], [place].  _______________________________________________________________________

## 2024-03-18 NOTE — Progress Notes (Signed)
 COVID Vaccine Completed: yes  Date of COVID positive in last 90 days:  PCP - Therisa Holm, PA Cardiologist - n/a  Chest x-ray -01/13/24 [facility] EKG - 12/11/23 Epic Stress Test - n/a ECHO - n/a Cardiac Cath - n/a Pacemaker/ICD device last checked: n/a Spinal Cord Stimulator: n/a  Bowel Prep - no solids after 6pm the night before. patient aware  Sleep Study - n/a CPAP -   Fasting Blood Sugar - preDM Checks Blood Sugar no checks or meds at home   Last dose of GLP1 agonist-  Ozempic, takes Sundays GLP1 instructions:  Patient states she has not taken in a few weeks   Last dose of SGLT-2 inhibitors-  N/A SGLT-2 instructions:  Do not take after     Blood Thinner Instructions:  Last dose: n/a Time: Aspirin Instructions: Last Dose:  Activity level: Can go up a flight of stairs and perform activities of daily living without stopping and without symptoms of chest pain. SOB with exertion. Has been worked up, related to allergies  Anesthesia review: BP 151/108 and 151/104. Patient reports she has taken her BP medication today. Denies any symptoms. Instructed to monitor at home and call PCP if still elevated.  Patient denies shortness of breath, fever, cough and chest pain at PAT appointment  Patient verbalized understanding of instructions that were given to them at the PAT appointment. Patient was also instructed that they will need to review over the PAT instructions again at home before surgery.

## 2024-03-19 ENCOUNTER — Other Ambulatory Visit: Payer: Self-pay

## 2024-03-19 ENCOUNTER — Encounter (HOSPITAL_COMMUNITY)
Admission: RE | Admit: 2024-03-19 | Discharge: 2024-03-19 | Disposition: A | Discharge status: Home / Self Care | Source: Ambulatory Visit | Attending: Surgery | Admitting: Surgery

## 2024-03-19 ENCOUNTER — Encounter (HOSPITAL_COMMUNITY): Payer: Self-pay

## 2024-03-19 VITALS — BP 151/104 | HR 81 | Temp 98.6°F | Resp 14 | Ht 64.0 in | Wt 258.0 lb

## 2024-03-19 DIAGNOSIS — Z01818 Encounter for other preprocedural examination: Secondary | ICD-10-CM | POA: Insufficient documentation

## 2024-03-19 HISTORY — DX: Anxiety disorder, unspecified: F41.9

## 2024-03-19 HISTORY — DX: Headache, unspecified: R51.9

## 2024-03-19 HISTORY — DX: Anemia, unspecified: D64.9

## 2024-03-19 HISTORY — DX: Pneumonia, unspecified organism: J18.9

## 2024-03-19 HISTORY — DX: Depression, unspecified: F32.A

## 2024-03-19 HISTORY — DX: Gastro-esophageal reflux disease without esophagitis: K21.9

## 2024-03-19 LAB — CBC WITH DIFFERENTIAL/PLATELET
Abs Immature Granulocytes: 0.01 K/uL (ref 0.00–0.07)
Basophils Absolute: 0.1 K/uL (ref 0.0–0.1)
Basophils Relative: 1 %
Eosinophils Absolute: 0.1 K/uL (ref 0.0–0.5)
Eosinophils Relative: 2 %
HCT: 37.8 % (ref 36.0–46.0)
Hemoglobin: 11.7 g/dL — ABNORMAL LOW (ref 12.0–15.0)
Immature Granulocytes: 0 %
Lymphocytes Relative: 38 %
Lymphs Abs: 2.4 K/uL (ref 0.7–4.0)
MCH: 24.6 pg — ABNORMAL LOW (ref 26.0–34.0)
MCHC: 31 g/dL (ref 30.0–36.0)
MCV: 79.4 fL — ABNORMAL LOW (ref 80.0–100.0)
Monocytes Absolute: 0.5 K/uL (ref 0.1–1.0)
Monocytes Relative: 7 %
Neutro Abs: 3.4 K/uL (ref 1.7–7.7)
Neutrophils Relative %: 52 %
Platelets: 525 K/uL — ABNORMAL HIGH (ref 150–400)
RBC: 4.76 MIL/uL (ref 3.87–5.11)
RDW: 15.6 % — ABNORMAL HIGH (ref 11.5–15.5)
WBC: 6.5 K/uL (ref 4.0–10.5)
nRBC: 0 % (ref 0.0–0.2)

## 2024-03-19 LAB — COMPREHENSIVE METABOLIC PANEL WITH GFR
ALT: 15 U/L (ref 0–44)
AST: 15 U/L (ref 15–41)
Albumin: 4.3 g/dL (ref 3.5–5.0)
Alkaline Phosphatase: 62 U/L (ref 38–126)
Anion gap: 11 (ref 5–15)
BUN: 17 mg/dL (ref 6–20)
CO2: 24 mmol/L (ref 22–32)
Calcium: 9.5 mg/dL (ref 8.9–10.3)
Chloride: 99 mmol/L (ref 98–111)
Creatinine, Ser: 0.83 mg/dL (ref 0.44–1.00)
GFR, Estimated: >60 mL/min (ref >60)
Glucose, Bld: 114 mg/dL — ABNORMAL HIGH (ref 70–99)
Potassium: 3.9 mmol/L (ref 3.5–5.1)
Sodium: 134 mmol/L — ABNORMAL LOW (ref 135–145)
Total Bilirubin: 0.9 mg/dL (ref 0.0–1.2)
Total Protein: 8.4 g/dL — ABNORMAL HIGH (ref 6.5–8.1)

## 2024-03-19 NOTE — Progress Notes (Signed)
 Type and screen +, redraw DOS

## 2024-03-25 ENCOUNTER — Ambulatory Visit (HOSPITAL_COMMUNITY): Admitting: Anesthesiology

## 2024-03-25 ENCOUNTER — Ambulatory Visit (HOSPITAL_COMMUNITY)

## 2024-03-25 ENCOUNTER — Ambulatory Visit (HOSPITAL_COMMUNITY)
Admission: RE | Admit: 2024-03-25 | Discharge: 2024-03-26 | Disposition: A | Discharge status: Home / Self Care | Attending: Surgery | Admitting: Surgery

## 2024-03-25 ENCOUNTER — Encounter (HOSPITAL_COMMUNITY)
Admission: RE | Disposition: A | Discharge status: Home / Self Care | Payer: Self-pay | Source: Home / Self Care | Attending: Surgery

## 2024-03-25 ENCOUNTER — Telehealth (HOSPITAL_COMMUNITY): Payer: Self-pay | Admitting: Pharmacy Technician

## 2024-03-25 ENCOUNTER — Encounter (HOSPITAL_COMMUNITY): Payer: Self-pay | Admitting: Surgery

## 2024-03-25 ENCOUNTER — Other Ambulatory Visit (HOSPITAL_COMMUNITY): Payer: Self-pay

## 2024-03-25 ENCOUNTER — Encounter (HOSPITAL_COMMUNITY): Payer: Self-pay | Admitting: Physician Assistant

## 2024-03-25 ENCOUNTER — Other Ambulatory Visit: Payer: Self-pay

## 2024-03-25 DIAGNOSIS — Z01818 Encounter for other preprocedural examination: Secondary | ICD-10-CM

## 2024-03-25 DIAGNOSIS — I10 Essential (primary) hypertension: Secondary | ICD-10-CM | POA: Insufficient documentation

## 2024-03-25 DIAGNOSIS — F418 Other specified anxiety disorders: Secondary | ICD-10-CM | POA: Insufficient documentation

## 2024-03-25 DIAGNOSIS — Z6841 Body Mass Index (BMI) 40.0 and over, adult: Secondary | ICD-10-CM

## 2024-03-25 DIAGNOSIS — D251 Intramural leiomyoma of uterus: Secondary | ICD-10-CM | POA: Insufficient documentation

## 2024-03-25 DIAGNOSIS — Z833 Family history of diabetes mellitus: Secondary | ICD-10-CM | POA: Insufficient documentation

## 2024-03-25 DIAGNOSIS — J45909 Unspecified asthma, uncomplicated: Secondary | ICD-10-CM | POA: Insufficient documentation

## 2024-03-25 DIAGNOSIS — Z79899 Other long term (current) drug therapy: Secondary | ICD-10-CM | POA: Insufficient documentation

## 2024-03-25 DIAGNOSIS — R7303 Prediabetes: Secondary | ICD-10-CM | POA: Diagnosis not present

## 2024-03-25 DIAGNOSIS — Z8349 Family history of other endocrine, nutritional and metabolic diseases: Secondary | ICD-10-CM | POA: Diagnosis not present

## 2024-03-25 DIAGNOSIS — K219 Gastro-esophageal reflux disease without esophagitis: Secondary | ICD-10-CM | POA: Diagnosis not present

## 2024-03-25 DIAGNOSIS — G4700 Insomnia, unspecified: Secondary | ICD-10-CM | POA: Insufficient documentation

## 2024-03-25 DIAGNOSIS — R1900 Intra-abdominal and pelvic swelling, mass and lump, unspecified site: Secondary | ICD-10-CM | POA: Diagnosis not present

## 2024-03-25 DIAGNOSIS — G43909 Migraine, unspecified, not intractable, without status migrainosus: Secondary | ICD-10-CM | POA: Diagnosis not present

## 2024-03-25 DIAGNOSIS — Z8249 Family history of ischemic heart disease and other diseases of the circulatory system: Secondary | ICD-10-CM | POA: Diagnosis not present

## 2024-03-25 HISTORY — PX: GASTRIC ROUX-EN-Y: SHX5262

## 2024-03-25 HISTORY — PX: UPPER GI ENDOSCOPY: SHX6162

## 2024-03-25 LAB — BPAM RBC
Blood Product Expiration Date: 202508132359
Blood Product Expiration Date: 202508232359
Unit Type and Rh: 600
Unit Type and Rh: 600

## 2024-03-25 LAB — TYPE AND SCREEN
ABO/RH(D): A NEG
Antibody Screen: POSITIVE
Donor AG Type: NEGATIVE
Donor AG Type: NEGATIVE
PT AG Type: NEGATIVE
Unit division: 0
Unit division: 0

## 2024-03-25 LAB — POCT PREGNANCY, URINE: Preg Test, Ur: NEGATIVE

## 2024-03-25 SURGERY — LAPAROSCOPIC ROUX-EN-Y GASTRIC
Anesthesia: General

## 2024-03-25 MED ORDER — MIDAZOLAM HCL 5 MG/5ML IJ SOLN
INTRAMUSCULAR | Status: DC | PRN
  Administered 2024-03-25: 2 mg via INTRAVENOUS

## 2024-03-25 MED ORDER — HYDRALAZINE HCL 20 MG/ML IJ SOLN: 10.0000 mg | INTRAMUSCULAR | Status: DC | PRN

## 2024-03-25 MED ORDER — ALBUTEROL SULFATE (2.5 MG/3ML) 0.083% IN NEBU: 3.0000 mL | INHALATION_SOLUTION | Freq: Four times a day (QID) | RESPIRATORY_TRACT | Status: DC | PRN

## 2024-03-25 MED ORDER — PANTOPRAZOLE SODIUM 40 MG IV SOLR
40.0000 mg | Freq: Every day | INTRAVENOUS | Status: DC
  Administered 2024-03-25: 40 mg via INTRAVENOUS
  Filled 2024-03-25: qty 10

## 2024-03-25 MED ORDER — ENSURE MAX PROTEIN PO LIQD
2.0000 [oz_av] | ORAL | Status: DC
  Administered 2024-03-26 (×5): 2 [oz_av] via ORAL

## 2024-03-25 MED ORDER — FIBRIN SEALANT 2 ML SINGLE DOSE KIT
2.0000 mL | PACK | Freq: Once | CUTANEOUS | Status: AC
  Administered 2024-03-25: 2 mL via TOPICAL
  Filled 2024-03-25: qty 2

## 2024-03-25 MED ORDER — FENTANYL CITRATE (PF) 100 MCG/2ML IJ SOLN
INTRAMUSCULAR | Status: AC
  Filled 2024-03-25: qty 2

## 2024-03-25 MED ORDER — ACETAMINOPHEN 500 MG PO TABS
1000.0000 mg | ORAL_TABLET | Freq: Three times a day (TID) | ORAL | Status: DC
  Administered 2024-03-25 – 2024-03-26 (×3): 1000 mg via ORAL
  Filled 2024-03-25 (×3): qty 2

## 2024-03-25 MED ORDER — LACTATED RINGERS IV SOLN: INTRAVENOUS | Status: DC

## 2024-03-25 MED ORDER — ENOXAPARIN (LOVENOX) PATIENT EDUCATION KIT
PACK | Freq: Once | Status: AC
  Filled 2024-03-25: qty 1

## 2024-03-25 MED ORDER — BUPIVACAINE-EPINEPHRINE (PF) 0.25% -1:200000 IJ SOLN
INTRAMUSCULAR | Status: AC
Start: 2024-03-25 — End: 2024-03-25
  Filled 2024-03-25: qty 60

## 2024-03-25 MED ORDER — ONDANSETRON HCL 4 MG/2ML IJ SOLN
INTRAMUSCULAR | Status: DC | PRN
  Administered 2024-03-25: 4 mg via INTRAVENOUS

## 2024-03-25 MED ORDER — FENTANYL CITRATE (PF) 100 MCG/2ML IJ SOLN
INTRAMUSCULAR | Status: AC
Start: 2024-03-25 — End: 2024-03-25
  Filled 2024-03-25: qty 2

## 2024-03-25 MED ORDER — BUPIVACAINE LIPOSOME 1.3 % IJ SUSP: 20.0000 mL | Freq: Once | INTRAMUSCULAR | Status: DC

## 2024-03-25 MED ORDER — SCOPOLAMINE 1 MG/3DAYS TD PT72
1.0000 | MEDICATED_PATCH | TRANSDERMAL | Status: DC
  Administered 2024-03-25: 1.5 mg via TRANSDERMAL
  Filled 2024-03-25: qty 1

## 2024-03-25 MED ORDER — SODIUM CHLORIDE 0.9 % IV SOLN: INTRAVENOUS | Status: AC

## 2024-03-25 MED ORDER — OXYCODONE HCL 5 MG PO TABS: 5.0000 mg | ORAL_TABLET | Freq: Once | ORAL | Status: DC | PRN

## 2024-03-25 MED ORDER — DEXAMETHASONE SODIUM PHOSPHATE 10 MG/ML IJ SOLN
INTRAMUSCULAR | Status: DC | PRN
  Administered 2024-03-25: 5 mg via INTRAVENOUS

## 2024-03-25 MED ORDER — 0.9 % SODIUM CHLORIDE (POUR BTL) OPTIME
TOPICAL | Status: DC | PRN
Start: 2024-03-25 — End: 2024-03-25
  Administered 2024-03-25: 1000 mL

## 2024-03-25 MED ORDER — ROCURONIUM BROMIDE 10 MG/ML (PF) SYRINGE
PREFILLED_SYRINGE | INTRAVENOUS | Status: AC
Start: 2024-03-25 — End: 2024-03-25
  Filled 2024-03-25: qty 10

## 2024-03-25 MED ORDER — ACETAMINOPHEN 500 MG PO TABS
1000.0000 mg | ORAL_TABLET | ORAL | Status: AC
  Administered 2024-03-25: 1000 mg via ORAL
  Filled 2024-03-25: qty 2

## 2024-03-25 MED ORDER — ONDANSETRON HCL 4 MG/2ML IJ SOLN: 4.0000 mg | INTRAMUSCULAR | Status: DC | PRN

## 2024-03-25 MED ORDER — ATOGEPANT 60 MG PO TABS: 60.0000 mg | ORAL_TABLET | Freq: Every day | ORAL | Status: DC

## 2024-03-25 MED ORDER — SUGAMMADEX SODIUM 200 MG/2ML IV SOLN
INTRAVENOUS | Status: AC
  Filled 2024-03-25: qty 2

## 2024-03-25 MED ORDER — ORAL CARE MOUTH RINSE: 15.0000 mL | Freq: Once | OROMUCOSAL | Status: AC

## 2024-03-25 MED ORDER — VISTASEAL 4 ML SINGLE DOSE KIT
4.0000 mL | PACK | Freq: Once | CUTANEOUS | Status: AC
  Administered 2024-03-25: 4 mL via TOPICAL
  Filled 2024-03-25: qty 4

## 2024-03-25 MED ORDER — BUPIVACAINE-EPINEPHRINE 0.25% -1:200000 IJ SOLN
INTRAMUSCULAR | Status: DC | PRN
  Administered 2024-03-25: 60 mL

## 2024-03-25 MED ORDER — FENTANYL CITRATE PF 50 MCG/ML IJ SOSY: 25.0000 ug | PREFILLED_SYRINGE | INTRAMUSCULAR | Status: DC | PRN

## 2024-03-25 MED ORDER — OXYCODONE HCL 5 MG/5ML PO SOLN: 5.0000 mg | Freq: Four times a day (QID) | ORAL | Status: DC | PRN

## 2024-03-25 MED ORDER — ROCURONIUM BROMIDE 10 MG/ML (PF) SYRINGE
PREFILLED_SYRINGE | INTRAVENOUS | Status: DC | PRN
  Administered 2024-03-25: 60 mg via INTRAVENOUS
  Administered 2024-03-25 (×3): 20 mg via INTRAVENOUS

## 2024-03-25 MED ORDER — DEXMEDETOMIDINE HCL IN NACL 80 MCG/20ML IV SOLN
INTRAVENOUS | Status: DC | PRN
  Administered 2024-03-25: 8 ug via INTRAVENOUS

## 2024-03-25 MED ORDER — ONDANSETRON HCL 4 MG/2ML IJ SOLN
INTRAMUSCULAR | Status: AC
  Filled 2024-03-25: qty 2

## 2024-03-25 MED ORDER — OXYCODONE HCL 5 MG/5ML PO SOLN: 5.0000 mg | Freq: Once | ORAL | Status: DC | PRN

## 2024-03-25 MED ORDER — MENTHOL 3 MG MT LOZG
1.0000 | LOZENGE | OROMUCOSAL | Status: DC | PRN
  Administered 2024-03-25: 3 mg via ORAL
  Filled 2024-03-25: qty 9

## 2024-03-25 MED ORDER — SIMETHICONE 80 MG PO CHEW: 80.0000 mg | CHEWABLE_TABLET | Freq: Four times a day (QID) | ORAL | Status: DC | PRN

## 2024-03-25 MED ORDER — HEPARIN SODIUM (PORCINE) 5000 UNIT/ML IJ SOLN
5000.0000 [IU] | Freq: Three times a day (TID) | INTRAMUSCULAR | Status: DC
  Administered 2024-03-25 – 2024-03-26 (×3): 5000 [IU] via SUBCUTANEOUS
  Filled 2024-03-25 (×3): qty 1

## 2024-03-25 MED ORDER — METOCLOPRAMIDE HCL 5 MG/ML IJ SOLN
10.0000 mg | Freq: Four times a day (QID) | INTRAMUSCULAR | Status: DC
  Administered 2024-03-25 – 2024-03-26 (×4): 10 mg via INTRAVENOUS
  Filled 2024-03-25 (×4): qty 2

## 2024-03-25 MED ORDER — DROPERIDOL 2.5 MG/ML IJ SOLN: 0.6250 mg | Freq: Once | INTRAMUSCULAR | Status: DC | PRN

## 2024-03-25 MED ORDER — EPHEDRINE SULFATE-NACL 50-0.9 MG/10ML-% IV SOSY
PREFILLED_SYRINGE | INTRAVENOUS | Status: DC | PRN
  Administered 2024-03-25: 5 mg via INTRAVENOUS

## 2024-03-25 MED ORDER — PROPOFOL 10 MG/ML IV BOLUS
INTRAVENOUS | Status: AC
Start: 2024-03-25 — End: 2024-03-25
  Filled 2024-03-25: qty 20

## 2024-03-25 MED ORDER — EPHEDRINE 5 MG/ML INJ
INTRAVENOUS | Status: AC
Start: 2024-03-25 — End: 2024-03-25
  Filled 2024-03-25: qty 5

## 2024-03-25 MED ORDER — LIDOCAINE HCL (PF) 2 % IJ SOLN
INTRAMUSCULAR | Status: DC | PRN
Start: 2024-03-25 — End: 2024-03-25
  Administered 2024-03-25: 60 mg via INTRADERMAL

## 2024-03-25 MED ORDER — GABAPENTIN 100 MG PO CAPS
100.0000 mg | ORAL_CAPSULE | Freq: Two times a day (BID) | ORAL | Status: DC
  Administered 2024-03-25 – 2024-03-26 (×2): 100 mg via ORAL
  Filled 2024-03-25 (×2): qty 1

## 2024-03-25 MED ORDER — HEPARIN SODIUM (PORCINE) 5000 UNIT/ML IJ SOLN
5000.0000 [IU] | INTRAMUSCULAR | Status: AC
  Administered 2024-03-25: 5000 [IU] via SUBCUTANEOUS
  Filled 2024-03-25: qty 1

## 2024-03-25 MED ORDER — GLYCOPYRROLATE 0.2 MG/ML IJ SOLN
INTRAMUSCULAR | Status: DC | PRN
  Administered 2024-03-25: .2 mg via INTRAVENOUS

## 2024-03-25 MED ORDER — DEXAMETHASONE SODIUM PHOSPHATE 10 MG/ML IJ SOLN
INTRAMUSCULAR | Status: AC
Start: 2024-03-25 — End: 2024-03-25
  Filled 2024-03-25: qty 1

## 2024-03-25 MED ORDER — SUGAMMADEX SODIUM 200 MG/2ML IV SOLN
INTRAVENOUS | Status: DC | PRN
  Administered 2024-03-25: 100 mg via INTRAVENOUS
  Administered 2024-03-25: 200 mg via INTRAVENOUS

## 2024-03-25 MED ORDER — DOCUSATE SODIUM 100 MG PO CAPS
100.0000 mg | ORAL_CAPSULE | Freq: Two times a day (BID) | ORAL | Status: DC
  Administered 2024-03-25 – 2024-03-26 (×3): 100 mg via ORAL
  Filled 2024-03-25 (×3): qty 1

## 2024-03-25 MED ORDER — PHENYLEPHRINE 80 MCG/ML (10ML) SYRINGE FOR IV PUSH (FOR BLOOD PRESSURE SUPPORT)
PREFILLED_SYRINGE | INTRAVENOUS | Status: AC
  Filled 2024-03-25: qty 10

## 2024-03-25 MED ORDER — STERILE WATER FOR IRRIGATION IR SOLN
Status: DC | PRN
  Administered 2024-03-25: 1000 mL

## 2024-03-25 MED ORDER — CHLORHEXIDINE GLUCONATE 0.12 % MT SOLN
15.0000 mL | Freq: Once | OROMUCOSAL | Status: AC
  Administered 2024-03-25: 15 mL via OROMUCOSAL

## 2024-03-25 MED ORDER — APREPITANT 40 MG PO CAPS
40.0000 mg | ORAL_CAPSULE | ORAL | Status: AC
  Administered 2024-03-25: 40 mg via ORAL
  Filled 2024-03-25: qty 1

## 2024-03-25 MED ORDER — TRAMADOL HCL 50 MG PO TABS: 50.0000 mg | ORAL_TABLET | Freq: Four times a day (QID) | ORAL | Status: DC | PRN

## 2024-03-25 MED ORDER — CHLORHEXIDINE GLUCONATE 4 % EX SOLN: 60.0000 mL | Freq: Once | CUTANEOUS | Status: DC

## 2024-03-25 MED ORDER — MIDAZOLAM HCL 2 MG/2ML IJ SOLN
INTRAMUSCULAR | Status: AC
  Filled 2024-03-25: qty 2

## 2024-03-25 MED ORDER — AMLODIPINE BESYLATE 5 MG PO TABS
5.0000 mg | ORAL_TABLET | Freq: Every day | ORAL | Status: DC
Start: 2024-03-26 — End: 2024-03-26
  Administered 2024-03-26: 5 mg via ORAL
  Filled 2024-03-25: qty 1

## 2024-03-25 MED ORDER — ROCURONIUM BROMIDE 10 MG/ML (PF) SYRINGE
PREFILLED_SYRINGE | INTRAVENOUS | Status: AC
  Filled 2024-03-25: qty 10

## 2024-03-25 MED ORDER — LACTATED RINGERS IR SOLN
Status: DC | PRN
  Administered 2024-03-25: 1000 mL

## 2024-03-25 MED ORDER — FLUTICASONE FUROATE-VILANTEROL 200-25 MCG/ACT IN AEPB
1.0000 | INHALATION_SPRAY | Freq: Every day | RESPIRATORY_TRACT | Status: DC
  Administered 2024-03-26: 1 via RESPIRATORY_TRACT
  Filled 2024-03-25: qty 28

## 2024-03-25 MED ORDER — GABAPENTIN 100 MG PO CAPS
100.0000 mg | ORAL_CAPSULE | ORAL | Status: AC
  Administered 2024-03-25: 100 mg via ORAL
  Filled 2024-03-25: qty 1

## 2024-03-25 MED ORDER — PROPOFOL 10 MG/ML IV BOLUS
INTRAVENOUS | Status: DC | PRN
  Administered 2024-03-25: 250 mg via INTRAVENOUS

## 2024-03-25 MED ORDER — PHENYLEPHRINE 80 MCG/ML (10ML) SYRINGE FOR IV PUSH (FOR BLOOD PRESSURE SUPPORT)
PREFILLED_SYRINGE | INTRAVENOUS | Status: DC | PRN
  Administered 2024-03-25 (×3): 80 ug via INTRAVENOUS
  Administered 2024-03-25: 160 ug via INTRAVENOUS
  Administered 2024-03-25 (×3): 80 ug via INTRAVENOUS
  Administered 2024-03-25 (×2): 160 ug via INTRAVENOUS

## 2024-03-25 MED ORDER — SODIUM CHLORIDE 0.9 % IV SOLN
2.0000 g | INTRAVENOUS | Status: AC
  Administered 2024-03-25: 2 g via INTRAVENOUS
  Filled 2024-03-25: qty 2

## 2024-03-25 MED ORDER — HYDROMORPHONE HCL 1 MG/ML IJ SOLN: 0.5000 mg | INTRAMUSCULAR | Status: DC | PRN

## 2024-03-25 MED ORDER — PROPOFOL 10 MG/ML IV BOLUS
INTRAVENOUS | Status: AC
  Filled 2024-03-25: qty 20

## 2024-03-25 MED ORDER — METHOCARBAMOL 1000 MG/10ML IJ SOLN: 500.0000 mg | Freq: Four times a day (QID) | INTRAMUSCULAR | Status: DC | PRN

## 2024-03-25 MED ORDER — FENTANYL CITRATE (PF) 100 MCG/2ML IJ SOLN
INTRAMUSCULAR | Status: DC | PRN
  Administered 2024-03-25: 100 ug via INTRAVENOUS

## 2024-03-25 MED ORDER — METOPROLOL SUCCINATE ER 50 MG PO TB24
50.0000 mg | ORAL_TABLET | Freq: Every day | ORAL | Status: DC
  Administered 2024-03-26: 50 mg via ORAL
  Filled 2024-03-25: qty 1

## 2024-03-25 MED ORDER — ACETAMINOPHEN 160 MG/5ML PO SOLN
1000.0000 mg | Freq: Three times a day (TID) | ORAL | Status: DC
  Administered 2024-03-26: 1000 mg via ORAL
  Filled 2024-03-25: qty 40.6

## 2024-03-25 SURGICAL SUPPLY — 55 items
APPLICATOR COTTON TIP 6 STRL (MISCELLANEOUS) IMPLANT
APPLICATOR VISTASEAL 35 (MISCELLANEOUS) ×1 IMPLANT
BAG COUNTER SPONGE SURGICOUNT (BAG) IMPLANT
BENZOIN TINCTURE PRP APPL 2/3 (GAUZE/BANDAGES/DRESSINGS) ×1 IMPLANT
BLADE SURG SZ11 CARB STEEL (BLADE) ×1 IMPLANT
BNDG ADH 1X3 SHEER STRL LF (GAUZE/BANDAGES/DRESSINGS) ×6 IMPLANT
CABLE HIGH FREQUENCY MONO STRZ (ELECTRODE) IMPLANT
CHLORAPREP W/TINT 26 (MISCELLANEOUS) ×2 IMPLANT
CLIP APPLIE 5 13 M/L LIGAMAX5 (MISCELLANEOUS) IMPLANT
CLIP APPLIE ROT 13.4 12 LRG (CLIP) IMPLANT
CLIP SUT LAPRA TY ABSORB (SUTURE) ×2 IMPLANT
COVER SURGICAL LIGHT HANDLE (MISCELLANEOUS) ×1 IMPLANT
DEVICE SUT QUICK LOAD TK 5 (SUTURE) IMPLANT
DEVICE SUT TI-KNOT TK 5X26 (SUTURE) IMPLANT
DEVICE SUTURE ENDOST 10MM (ENDOMECHANICALS) ×1 IMPLANT
DRAIN PENROSE 0.25X18 (DRAIN) ×1 IMPLANT
ELECT REM PT RETURN 15FT ADLT (MISCELLANEOUS) ×1 IMPLANT
GAUZE 4X4 16PLY ~~LOC~~+RFID DBL (SPONGE) ×1 IMPLANT
GLOVE BIO SURGEON STRL SZ 6 (GLOVE) ×1 IMPLANT
GLOVE INDICATOR 6.5 STRL GRN (GLOVE) ×1 IMPLANT
GOWN STRL REUS W/ TWL LRG LVL3 (GOWN DISPOSABLE) ×1 IMPLANT
IRRIGATION SUCT STRKRFLW 2 WTP (MISCELLANEOUS) ×1 IMPLANT
KIT BASIN OR (CUSTOM PROCEDURE TRAY) ×1 IMPLANT
KIT GASTRIC LAVAGE 34FR ADT (SET/KITS/TRAYS/PACK) ×1 IMPLANT
KIT TURNOVER KIT A (KITS) ×1 IMPLANT
MARKER SKIN DUAL TIP RULER LAB (MISCELLANEOUS) ×1 IMPLANT
MAT PREVALON FULL STRYKER (MISCELLANEOUS) ×1 IMPLANT
NDL SPNL 22GX3.5 QUINCKE BK (NEEDLE) ×1 IMPLANT
NEEDLE SPNL 22GX3.5 QUINCKE BK (NEEDLE) ×1 IMPLANT
PACK CARDIOVASCULAR III (CUSTOM PROCEDURE TRAY) ×1 IMPLANT
RELOAD STAPLE 60 2.6 WHT THN (STAPLE) ×2 IMPLANT
RELOAD STAPLE 60 3.6 BLU REG (STAPLE) ×2 IMPLANT
RELOAD STAPLE 60 3.8 GOLD REG (STAPLE) IMPLANT
RELOAD STAPLE 60 4.1 GRN THCK (STAPLE) IMPLANT
RELOAD SUT SNGL STCH ABSRB 2-0 (ENDOMECHANICALS) ×4 IMPLANT
RELOAD SUT SNGL STCH BLK 2-0 (ENDOMECHANICALS) ×4 IMPLANT
SCISSORS LAP 5X45 EPIX DISP (ENDOMECHANICALS) ×1 IMPLANT
SET TUBE SMOKE EVAC HIGH FLOW (TUBING) ×1 IMPLANT
SHEARS HARMONIC 45 ACE (MISCELLANEOUS) ×1 IMPLANT
SLEEVE ADV FIXATION 12X100MM (TROCAR) IMPLANT
SLEEVE ADV FIXATION 5X100MM (TROCAR) ×2 IMPLANT
SOLUTION ANTFG W/FOAM PAD STRL (MISCELLANEOUS) ×1 IMPLANT
STAPLER ECHELON BIOABSB 60 FLE (MISCELLANEOUS) IMPLANT
STAPLER ECHELON LONG 60 440 (INSTRUMENTS) ×1 IMPLANT
STRIP CLOSURE SKIN 1/2X4 (GAUZE/BANDAGES/DRESSINGS) ×1 IMPLANT
SUT MNCRL AB 4-0 PS2 18 (SUTURE) ×1 IMPLANT
SUT SURGIDAC NAB ES-9 0 48 120 (SUTURE) IMPLANT
SUT VIC AB 2-0 SH 27X BRD (SUTURE) ×1 IMPLANT
SYR 20ML LL LF (SYRINGE) ×1 IMPLANT
TOWEL OR 17X26 10 PK STRL BLUE (TOWEL DISPOSABLE) ×1 IMPLANT
TRAP SPECIMEN MUCUS 40CC (MISCELLANEOUS) IMPLANT
TROCAR ADV FIXATION 12X100MM (TROCAR) ×1 IMPLANT
TROCAR ADV FIXATION 5X100MM (TROCAR) ×1 IMPLANT
TROCAR XCEL NON-BLD 5MMX100MML (ENDOMECHANICALS) ×1 IMPLANT
TUBING CONNECTING 10 (TUBING) IMPLANT

## 2024-03-25 NOTE — Transfer of Care (Signed)
 Immediate Anesthesia Transfer of Care Note  Patient: Amy Ferguson  Procedure(s) Performed: LAPAROSCOPIC ROUX-EN-Y GASTRIC ENDOSCOPY, UPPER GI TRACT  Patient Location: PACU  Anesthesia Type:General  Level of Consciousness: drowsy and patient cooperative  Airway & Oxygen Therapy: Patient Spontanous Breathing and Patient connected to face mask oxygen  Post-op Assessment: Report given to RN and Post -op Vital signs reviewed and stable  Post vital signs: Reviewed and stable  Last Vitals:  Vitals Value Taken Time  BP 130/73 03/25/24 13:00  Temp 36.8 C 03/25/24 12:56  Pulse 71 03/25/24 13:03  Resp 20 03/25/24 13:03  SpO2 100 % 03/25/24 13:03  Vitals shown include unfiled device data.  Last Pain:  Vitals:   03/25/24 1256  TempSrc:   PainSc: Asleep         Complications: No notable events documented.

## 2024-03-25 NOTE — Progress Notes (Signed)
 Brief intraoperative consult:  S: Pt in OR for Roux-en-Y gastric bypass. Intraoperative identification of a pelvic mass. Called to OR to evaluate.  O: Intra-op note of large white multilobulated mass. Difficult to view into pelvis clearly but one normal ovary visualized and partial visualization of uterine fundus. Mass possibly arising from left adnexal versus pedunculated from uterus.  Visualized below is one normal appearing ovary and the uterine fundus:   A/P: Pt with incidentally identified pelvic mass. 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 Possibly arising from the left adnexa as it appears we were able to visualize a normal right ovary with corpus luteum.   Recommend: - Pelvic ultrasound with transvaginal - Tumor markers: CA125, CEA, CA 19-9, Inhibin B, AFP, LDH (has negative urine pregnancy) - Follow-up outpatient with Gyn Onc - Follow-up pelvic washings that were collected by surgical team

## 2024-03-25 NOTE — Anesthesia Procedure Notes (Signed)
 Procedure Name: Intubation Date/Time: 03/25/2024 10:18 AM  Performed by: Franchot [name] RAMAN, CRNAPre-anesthesia Checklist: Patient identified, Emergency Drugs available, Suction available and Patient being monitored Patient Re-evaluated:Patient Re-evaluated prior to induction Oxygen Delivery Method: Circle System Utilized Preoxygenation: Pre-oxygenation with 100% oxygen Induction Type: IV induction Ventilation: Mask ventilation without difficulty Laryngoscope Size: Mac and 3 Grade View: Grade I Tube type: Oral Tube size: 7.0 mm Number of attempts: 1 Airway Equipment and Method: Stylet Placement Confirmation: ETT inserted through vocal cords under direct vision, positive ETCO2 and breath sounds checked- equal and bilateral Secured at: 22 cm Tube secured with: Tape Dental Injury: Teeth and Oropharynx as per pre-operative assessment

## 2024-03-25 NOTE — Op Note (Signed)
 Amy Ferguson [id number] 1981-06-04 03/25/2024  Preoperative diagnosis: severe obesity  Postoperative diagnosis: Same   Procedure: Upper endoscopy   Surgeon: Camellia CHRISTELLA Blush M.D., FACS   Anesthesia: Gen.   Indications for procedure: 43 y.o. 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  Description of procedure: After we have completed the new gastrojejunostomy, I scrubbed out and obtained the Olympus endoscope. I gently placed endoscope in the patient's oropharynx and gently glided it down the esophagus without any difficulty under direct visualization. Once I was in the gastric pouch, I insufflated the pouch was air. The pouch was approximately 5cm in size. I was able to cannulate and advanced the scope through the gastrojejunostomy. Dr.Connor had placed saline in the upper abdomen. Upon further insufflation of the gastric pouch there was no evidence of bubbles. Upon further inspection of the gastric pouch, the mucosa appeared normal. There is no evidence of any mucosal abnormality. The gastric pouch and Roux limb were decompressed. The width of the gastrojejunal anastomosis was at least 3 cm. The scope was withdrawn. The patient tolerated this portion of the procedure well. Please see Dr Donneta operative note for details regarding the laparoscopic roux-en-y gastric bypass.  Camellia CHRISTELLA. Blush, MD, FACS General, Bariatric, & Minimally Invasive Surgery [facility]

## 2024-03-25 NOTE — Telephone Encounter (Signed)
 Patient Product/process development scientist completed.    The patient is insured through [employer] [facility]. Patient has [employer], may use a copay card, and/or apply for patient assistance if available.    Ran test claim for enoxaparin  (Lovenox ) 40 mg/0.4 ml and the current 30 day co-pay is $5.00.   This test claim was processed through [facility]- copay amounts may vary at other pharmacies due to pharmacy/plan contracts, or as the patient moves through the different stages of their insurance plan.     Reyes Sharps, CPHT Pharmacy Technician III Certified Patient Advocate [facility] Patient Advocate Team Direct Number: [phone]  Fax: [phone]

## 2024-03-25 NOTE — Op Note (Signed)
 Operative Note  JESUS POPLIN  [id number]  [id number]  03/25/2024   Surgeon: Mitzie Freund MD   Assistant: Camellia Blush MD   Procedure performed: laparoscopic Roux-en-Y gastric bypass ( antecolic, antegastric), upper endoscopy   Preop diagnosis: Morbid obesity Body mass index is 42.4 kg/m., Post-op diagnosis/intraop findings: same. Incidentally noted large multilobulated mass likely arising from the left ovary. Dr. Eldonna was kind enough to provide intraoperative consultation and recommendations for further testing. Pelvic washings were collected and sent for cytology/path.    Specimens:pelvic washings Retained items: none  EBL: minimal cc Complications: none   Description of procedure: After confirming informed consent and administration of prophylactic lovenox  in holding, the patient was taken to the operating room and placed supine on operating room table where general endotracheal anesthesia was initiated, preoperative antibiotics were administered, SCDs applied, and a formal timeout was performed. The abdomen was prepped and draped in usual sterile fashion. Peritoneal access was gained using optical entry technique in the left upper quadrant and insufflation to 15 mmHg ensued without issue. Gross inspection revealed no evidence of injury.  Under direct visualization the remaining trochars were inserted. A laparoscopic assisted bilateral taps block was performed for postoperative pain control.  A large mass arising from the pelvis was noted.  Photos in media section demonstrate right ovary and uterus, mass seems to arise from the left ovary.  Dr. Eldonna kindly came to the OR to look at this; pelvic washings were collected and sent and tumor markers/pelvic ultrasound will be ordered postoperatively.  The omentum was reflected cephalad and the ligament of Treitz identified.  The small bowel was run in its entirety from the ligament of Treitz to the ileocecal valve and confirmed to have no  involvement with the pelvic mass.  There was no apparent colonic involvement with the pelvic mass either.  The small bowel was followed to a point 50 cm distal to ligament of Treitz at which location the bowel was divided with a white load linear cutting stapler. A Penrose was sutured to the Roux side of the staple line for future identification. The bowel was measured another 100 cm distal to this and and the site for the jejunojejunostomy was aligned with the end of the biliopancreatic limb. Enterotomies were made with the Harmonic scalpel and the anastomosis was created with the 60 mm white load linear cutting stapler. The common enterotomy was closed with running 3-0 Vicryl starting on either end and tying centrally. The mesenteric defect was closed with running silk suture secured with Lapra-Ty's. The anastomosis was inspected and appeared widely patent, hemostatic with no gaps in the suture line.  Vistaseal  was injected over the anastomosis. We then divided the omentum using the harmonic scalpel.  The patient was then placed in steep reverse Trendelenburg. The liver retractor was inserted through a subxiphoid incision and secured for fixed retraction of the left lobe. The Harmonic scalpel was used to enter the perigastric plane and the lesser sac at a point 5 cm distal to the GE junction on the lesser curve. The angle of His was gently bluntly dissected and the target shape of the pouch visualized to exclude any residual fundus. After confirming that all tubes have been removed from the stomach, the gastric pouch was created with serial fires of the linear cutting stapler. As we approached the angle of His, the Ewald tube was inserted to confirm no impingement on the GE junction. The Roux limb with its attached Penrose drain was then identified and  brought up to meet the gastric pouch ensuring no twist in the small bowel mesentery. The staple line of the small bowel is directed to the patient's left side. A  running 3-0 Vicryl was used to create a posterior suture line for our anastomosis between the gastric pouch and the small bowel. Gastrotomy and enterotomy was made with the Harmonic scalpel and a blue load linear cutting stapler was used to create a gastrojejunal anastomosis approximately 2.5cm wide. The common enterotomy was closed with running 3-0 Vicryl starting at either end and tying centrally. At this juncture the Ewald tube was passed through the gastrojejunal anastomosis. An anterior layer of running 3-0 Vicryl was used to complete the gastrojejunal anastomosis. The ewald tube was removed without difficulty. The Friendship space was closed with a figure-of-eight silk suture.  At this point the assistant performed an upper endoscopy with the Roux limb gently clamped with a bowel clamp. Irrigation is instilled in the upper abdomen for a leak test. Please see his separate operative note- the anastomosis is noted to be patent, viable, circumferentially intact and hemostatic without any leak or bubbles present. The endoscope was removed and the abdomen once again surveyed.  Additional Vistaseal  was sprayed over the gastrojejunal anastomosis.  The liver retractor was removed under direct visualization. The abdomen was then desufflated and all remaining trochars removed. The skin incisions were closed with running subcuticular Monocryl; benzoin, Steri-Strips and Band-Aids were applied The patient was then awakened, extubated and taken to PACU in stable condition.     All counts were correct at the completion of the case.

## 2024-03-25 NOTE — Interval H&P Note (Signed)
 History and Physical Interval Note:  03/25/2024 9:34 AM  Amy Ferguson  has presented today for surgery, with the diagnosis of MORBID OBESITY.  The various methods of treatment have been discussed with the patient and family. After consideration of risks, benefits and other options for treatment, the patient has consented to  Procedure(s) with comments: LAPAROSCOPIC ROUX-EN-Y GASTRIC (N/A) - LAPAROSCOPIC GASTRIC BYPASS RNY ENDOSCOPY, UPPER GI TRACT (N/A) as a surgical intervention.  The patient's history has been reviewed, patient examined, no change in status, stable for surgery.  I have reviewed the patient's chart and labs.  Questions were answered to the patient's satisfaction.     [name] DELENA Freund
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
## 2024-03-25 NOTE — Anesthesia Postprocedure Evaluation (Signed)
 Anesthesia Post Note  Patient: Amy Ferguson  Procedure(s) Performed: LAPAROSCOPIC ROUX-EN-Y GASTRIC ENDOSCOPY, UPPER GI TRACT     Patient location during evaluation: PACU Anesthesia Type: General Level of consciousness: awake and alert Pain management: pain level controlled Vital Signs Assessment: post-procedure vital signs reviewed and stable Respiratory status: spontaneous breathing, nonlabored ventilation, respiratory function stable and patient connected to nasal cannula oxygen Cardiovascular status: blood pressure returned to baseline and stable Postop Assessment: no apparent nausea or vomiting Anesthetic complications: no   No notable events documented.  Last Vitals:  Vitals:   03/25/24 1546 03/25/24 1645  BP: 119/73 128/80  Pulse: 66 63  Resp: 16 16  Temp: 37.1 C 36.6 C  SpO2: 97% 99%    Last Pain:  Vitals:   03/25/24 1645  TempSrc: Oral  PainSc:                  Cordella SQUIBB Christene Pounds

## 2024-03-25 NOTE — Anesthesia Preprocedure Evaluation (Addendum)
 Anesthesia Evaluation  Patient identified by MRN, date of birth, ID band Patient awake    Reviewed: Allergy & Precautions, NPO status , Patient's Chart, lab work & pertinent test results  Airway Mallampati: III  TM Distance: >3 FB Neck ROM: Full    Dental no notable dental hx.    Pulmonary asthma    Pulmonary exam normal        Cardiovascular hypertension, Pt. on medications and Pt. on home beta blockers  Rhythm:Regular Rate:Normal     Neuro/Psych  Headaches  Anxiety Depression       GI/Hepatic Neg liver ROS,GERD  ,,  Endo/Other    Class 3 obesity  Renal/GU negative Renal ROS  negative genitourinary   Musculoskeletal negative musculoskeletal ROS (+)    Abdominal Normal abdominal exam  (+)   Peds  Hematology  (+) Blood dyscrasia, anemia   Anesthesia Other Findings   Reproductive/Obstetrics                              Anesthesia Physical Anesthesia Plan  ASA: 3  Anesthesia Plan: General   Post-op Pain Management: Tylenol  PO (pre-op)* and Gabapentin  PO (pre-op)*   Induction: Intravenous  PONV Risk Score and Plan: 3 and Ondansetron , Dexamethasone , Scopolamine  patch - Pre-op, Treatment may vary due to age or medical condition and Midazolam   Airway Management Planned: Mask and Oral ETT  Additional Equipment: None  Intra-op Plan:   Post-operative Plan: Extubation in OR  Informed Consent: I have reviewed the patients History and Physical, chart, labs and discussed the procedure including the risks, benefits and alternatives for the proposed anesthesia with the patient or authorized representative who has indicated his/her understanding and acceptance.     Dental advisory given  Plan Discussed with: CRNA  Anesthesia Plan Comments:         Anesthesia Quick Evaluation

## 2024-03-26 ENCOUNTER — Encounter (HOSPITAL_COMMUNITY): Payer: Self-pay | Admitting: Surgery

## 2024-03-26 LAB — COMPREHENSIVE METABOLIC PANEL WITH GFR
ALT: 15 U/L (ref 0–44)
AST: 16 U/L (ref 15–41)
Albumin: 3.6 g/dL (ref 3.5–5.0)
Alkaline Phosphatase: 45 U/L (ref 38–126)
Anion gap: 9 (ref 5–15)
BUN: 14 mg/dL (ref 6–20)
CO2: 25 mmol/L (ref 22–32)
Calcium: 8.8 mg/dL — ABNORMAL LOW (ref 8.9–10.3)
Chloride: 100 mmol/L (ref 98–111)
Creatinine, Ser: 0.91 mg/dL (ref 0.44–1.00)
GFR, Estimated: >60 mL/min (ref >60)
Glucose, Bld: 104 mg/dL — ABNORMAL HIGH (ref 70–99)
Potassium: 3.8 mmol/L (ref 3.5–5.1)
Sodium: 134 mmol/L — ABNORMAL LOW (ref 135–145)
Total Bilirubin: 0.8 mg/dL (ref 0.0–1.2)
Total Protein: 7.1 g/dL (ref 6.5–8.1)

## 2024-03-26 LAB — CBC
HCT: 32.2 % — ABNORMAL LOW (ref 36.0–46.0)
Hemoglobin: 10 g/dL — ABNORMAL LOW (ref 12.0–15.0)
MCH: 24.9 pg — ABNORMAL LOW (ref 26.0–34.0)
MCHC: 31.1 g/dL (ref 30.0–36.0)
MCV: 80.1 fL (ref 80.0–100.0)
Platelets: 416 K/uL — ABNORMAL HIGH (ref 150–400)
RBC: 4.02 MIL/uL (ref 3.87–5.11)
RDW: 15.7 % — ABNORMAL HIGH (ref 11.5–15.5)
WBC: 9.3 K/uL (ref 4.0–10.5)
nRBC: 0 % (ref 0.0–0.2)

## 2024-03-26 LAB — MAGNESIUM: Magnesium: 1.9 mg/dL (ref 1.7–2.4)

## 2024-03-26 LAB — CEA: CEA: 0.7 ng/mL (ref 0.0–4.7)

## 2024-03-26 LAB — HEMOGLOBIN AND HEMATOCRIT, BLOOD
HCT: 31 % — ABNORMAL LOW (ref 36.0–46.0)
Hemoglobin: 9.6 g/dL — ABNORMAL LOW (ref 12.0–15.0)

## 2024-03-26 LAB — LACTATE DEHYDROGENASE: LDH: 102 U/L (ref 98–192)

## 2024-03-26 LAB — CANCER ANTIGEN 19-9: CA 19-9: 98 U/mL — ABNORMAL HIGH (ref 0–35)

## 2024-03-26 LAB — CA 125: Cancer Antigen (CA) 125: 1588 U/mL — ABNORMAL HIGH (ref 0.0–38.1)

## 2024-03-26 MED ORDER — DOCUSATE SODIUM 100 MG PO CAPS: 100.0000 mg | ORAL_CAPSULE | Freq: Two times a day (BID) | ORAL | 0 refills | Status: AC

## 2024-03-26 MED ORDER — PANTOPRAZOLE SODIUM 40 MG PO TBEC: 40.0000 mg | DELAYED_RELEASE_TABLET | Freq: Every day | ORAL | 0 refills | Status: AC

## 2024-03-26 MED ORDER — ENOXAPARIN SODIUM 40 MG/0.4ML IJ SOSY: 40.0000 mg | PREFILLED_SYRINGE | Freq: Two times a day (BID) | INTRAMUSCULAR | 0 refills | Status: DC

## 2024-03-26 MED ORDER — TRAMADOL HCL 50 MG PO TABS: 50.0000 mg | ORAL_TABLET | Freq: Four times a day (QID) | ORAL | 0 refills | Status: DC | PRN

## 2024-03-26 MED ORDER — ONDANSETRON 4 MG PO TBDP: 4.0000 mg | ORAL_TABLET | Freq: Four times a day (QID) | ORAL | 0 refills | Status: DC | PRN

## 2024-03-26 MED ORDER — GABAPENTIN 100 MG PO CAPS: 100.0000 mg | ORAL_CAPSULE | Freq: Two times a day (BID) | ORAL | 0 refills | Status: DC

## 2024-03-26 MED ORDER — ACETAMINOPHEN 500 MG PO TABS: 1000.0000 mg | ORAL_TABLET | Freq: Three times a day (TID) | ORAL | Status: AC

## 2024-03-26 NOTE — Progress Notes (Signed)
   03/26/24 0919  TOC Brief Assessment  Insurance and Status Reviewed  Patient has primary care physician Yes  Home environment has been reviewed single family home  Prior level of function: independent  Prior/Current Home Services No current home services  Social Drivers of Health Review SDOH reviewed no interventions necessary  Readmission risk has been reviewed Yes  Transition of care needs no transition of care needs at this time    Heather Saltness, MSW, LCSW Clinical Social Worker IP Care Management 03/26/2024 9:19 AM

## 2024-03-26 NOTE — Progress Notes (Signed)
 Patient alert and oriented, pain is controlled. Patient is tolerating fluids, advanced to protein shake today, patient is tolerating well. Reviewed Gastric sleeve/bypass discharge instructions with patient and patient is able to articulate understanding. Provided information on BELT program, Support Group, BSTOP-D, and [facility] outpatient pharmacy. Communicated general update of patient status to surgeon. All questions answered. 24hr fluid recall is 480 mL per hydration protocol, bariatric nurse coordinator to make follow-up phone call within one week.    Thank you,  Lubertha Basque, RN, MSN Bariatric Nurse Coordinator [phone] (office)

## 2024-03-26 NOTE — Progress Notes (Signed)
 S: uneventful night. Sore but no real pain. Tolerating liquids without issue, ambulating independently  O: Vitals, labs, intake/output, and orders reviewed at this time. Afebrile, no tachycardia, normotensive, normal RR, sats 99% RA. PO 480, UOP 1550.CMP unremarkable, WBC 9.3 (6.5 preop), hgb 10 (11.7 preop), plt 416 (525 preop).   PRN meds since arriving to floor   Gen: A&Ox3, no distress  Chest: unlabored respirations, RRR Abd: soft, nontender, nondistended, incision(s) c/d/i without cellulitis or hematoma Ext: warm, no edema Neuro: grossly normal  Lines/tubes/drains: PIV  A/P: POD 1 s/p laparoscopic roux en y gastric bypass -Continue clears/ protein shakes -Ambulate, SCDs while in bed, aggressive pulm toilet -Will recheck H&H this afternoon, possible PM discharge if stable/ patient doing well  L ovarian mass: CA 19-9 98 (normal <35), CA 125 1588 (normal <38), CEA 0.7. AFP, LDH, Inhibin B, pelvic washings cytology pending. Pelvic US  with poorly visualized complex cystic 14cm area in pelvis, L ovary not visualized but R ovary and uterus seen and normal, some small intramural fibroids. Follow up with Dr. Eldonna being arranged.   Mitzie Freund, MD [facility], [place]

## 2024-03-26 NOTE — Plan of Care (Signed)
 ?  Problem: Clinical Measurements: ?Goal: Ability to maintain clinical measurements within normal limits will improve ?Outcome: Progressing ?Goal: Will remain free from infection ?Outcome: Progressing ?Goal: Diagnostic test results will improve ?Outcome: Progressing ?  ?

## 2024-03-26 NOTE — Discharge Instructions (Signed)

## 2024-03-27 ENCOUNTER — Telehealth: Payer: Self-pay

## 2024-03-27 LAB — CYTOLOGY - NON PAP

## 2024-03-27 LAB — AFP TUMOR MARKER: AFP, Serum, Tumor Marker: 2.6 ng/mL (ref 0.0–6.4)

## 2024-03-27 LAB — INHIBIN B: Inhibin B: 9 pg/mL

## 2024-03-27 NOTE — Telephone Encounter (Signed)
 Spoke with patient and per Dr Eldonna had appointment moved from 8/18 to 7/28... patient confirmed.Amy Ferguson

## 2024-03-28 ENCOUNTER — Encounter: Payer: Self-pay | Admitting: Psychiatry

## 2024-03-29 LAB — BPAM RBC
Blood Product Expiration Date: 202508132359
Blood Product Expiration Date: 202508232359
Unit Type and Rh: 600
Unit Type and Rh: 600

## 2024-03-29 LAB — TYPE AND SCREEN
ABO/RH(D): A NEG
Antibody Screen: POSITIVE
Unit division: 0
Unit division: 0

## 2024-03-31 ENCOUNTER — Encounter: Payer: Self-pay | Admitting: Psychiatry

## 2024-03-31 ENCOUNTER — Ambulatory Visit: Admitting: Psychiatry

## 2024-03-31 ENCOUNTER — Other Ambulatory Visit: Payer: Self-pay | Admitting: Psychiatry

## 2024-03-31 ENCOUNTER — Inpatient Hospital Stay: Attending: Psychiatry | Admitting: Psychiatry

## 2024-03-31 ENCOUNTER — Inpatient Hospital Stay (HOSPITAL_BASED_OUTPATIENT_CLINIC_OR_DEPARTMENT_OTHER): Admitting: Gynecologic Oncology

## 2024-03-31 VITALS — BP 136/98 | HR 87 | Temp 99.0°F | Resp 20 | Ht 63.0 in | Wt 241.2 lb

## 2024-03-31 DIAGNOSIS — Z9884 Bariatric surgery status: Secondary | ICD-10-CM | POA: Diagnosis not present

## 2024-03-31 DIAGNOSIS — J45909 Unspecified asthma, uncomplicated: Secondary | ICD-10-CM | POA: Diagnosis not present

## 2024-03-31 DIAGNOSIS — Z7951 Long term (current) use of inhaled steroids: Secondary | ICD-10-CM | POA: Diagnosis not present

## 2024-03-31 DIAGNOSIS — R971 Elevated cancer antigen 125 [CA 125]: Secondary | ICD-10-CM | POA: Diagnosis not present

## 2024-03-31 DIAGNOSIS — I10 Essential (primary) hypertension: Secondary | ICD-10-CM | POA: Diagnosis not present

## 2024-03-31 DIAGNOSIS — Z79899 Other long term (current) drug therapy: Secondary | ICD-10-CM | POA: Insufficient documentation

## 2024-03-31 DIAGNOSIS — R978 Other abnormal tumor markers: Secondary | ICD-10-CM

## 2024-03-31 DIAGNOSIS — Z6841 Body Mass Index (BMI) 40.0 and over, adult: Secondary | ICD-10-CM | POA: Insufficient documentation

## 2024-03-31 DIAGNOSIS — F32A Depression, unspecified: Secondary | ICD-10-CM | POA: Insufficient documentation

## 2024-03-31 DIAGNOSIS — R1900 Intra-abdominal and pelvic swelling, mass and lump, unspecified site: Secondary | ICD-10-CM

## 2024-03-31 DIAGNOSIS — F419 Anxiety disorder, unspecified: Secondary | ICD-10-CM | POA: Diagnosis not present

## 2024-03-31 DIAGNOSIS — K219 Gastro-esophageal reflux disease without esophagitis: Secondary | ICD-10-CM | POA: Diagnosis not present

## 2024-03-31 MED ORDER — LORAZEPAM 0.5 MG PO TABS: 0.5000 mg | ORAL_TABLET | Freq: Once | ORAL | 0 refills | Status: DC | PRN
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
## 2024-03-31 NOTE — Patient Instructions (Addendum)
 Plan on having imaging including CT scan of the chest, abdomen, and pelvis along with MRI of the pelvis.  Nothing to eat of drink 4 hours before your CT scan. No need to drink oral contrast for CT.  Preparing for your Surgery  Plan for surgery on 04/29/2024 with Dr. Hoy Masters at [facility]. You will be scheduled for mini laparotomy for controlled pelvic cyst drainage, robotic assisted laparoscopic unilateral salpingo-oophorectomy, possible total hysterectomy (removal of the uterus and cervix), possible bilateral salpingo-oophorectomy (removal of both ovaries and fallopian tubes), possible staging if a precancer or cancer seen.   Pre-operative Testing -You will receive a phone call from presurgical testing at [facility] to arrange for a pre-operative appointment and lab work.  -Bring your insurance card, copy of an advanced directive if applicable, medication list  -At that visit, you will be asked to sign a consent for a possible blood transfusion in case a transfusion becomes necessary during surgery.  The need for a blood transfusion is rare but having consent is a necessary part of your care.     -You should not be taking blood thinners or aspirin at least ten days prior to surgery unless instructed by your surgeon.  -Do not take supplements such as fish oil (omega 3), red yeast rice, turmeric before your surgery. STOP TAKING AT LEAST 10 DAYS BEFORE SURGERY. You want to avoid medications with aspirin in them including headache powders such as BC or Goody's), Excedrin migraine.  -If you are taking a GLP-1 medication/injection such as Ozempic, Mounjaro, [id number], this needs to be held before surgery for at least 7 days before.  Day Before Surgery at Home -You will be asked to take in a light diet the day before surgery and FOLLOW YOUR BARIATRIC DIET. You will be advised you can have clear liquids up until 3 hours before your surgery.    Eat a light diet the day before  surgery.  Examples including soups, broths, toast, yogurt, mashed potatoes.  AVOID GAS PRODUCING FOODS AND BEVERAGES. Things to avoid include carbonated beverages (fizzy beverages, sodas), raw fruits and raw vegetables (uncooked), or beans.   If your bowels are filled with gas, your surgeon will have difficulty visualizing your pelvic organs which increases your surgical risks.  Your role in recovery Your role is to become active as soon as directed by your doctor, while still giving yourself time to heal.  Rest when you feel tired. You will be asked to do the following in order to speed your recovery:  - Cough and breathe deeply. This helps to clear and expand your lungs and can prevent pneumonia after surgery.  - STAY ACTIVE WHEN YOU GET HOME. Do mild physical activity. Walking or moving your legs help your circulation and body functions return to normal. Do not try to get up or walk alone the first time after surgery.   -If you develop swelling on one leg or the other, pain in the back of your leg, redness/warmth in one of your legs, please call the office or go to the Emergency Room to have a doppler to rule out a blood clot. For shortness of breath, chest pain-seek care in the Emergency Room as soon as possible. - Actively manage your pain. Managing your pain lets you move in comfort. We will ask you to rate your pain on a scale of zero to 10. It is your responsibility to tell your doctor or nurse where and how much you hurt  so your pain can be treated.  Special Considerations -If you are diabetic, you may be placed on insulin after surgery to have closer control over your blood sugars to promote healing and recovery.  This does not mean that you will be discharged on insulin.  If applicable, your oral antidiabetics will be resumed when you are tolerating a solid diet.  -Your final pathology results from surgery should be available around one week after surgery and the results will be relayed to  you when available.  -Dr. Olam Mill is the surgeon that assists your GYN Oncologist with surgery.  If you end up staying the night, the next day after your surgery you will either see Dr. Viktoria, Dr. Eldonna, or Dr. Olam Mill.  -FMLA forms can be faxed to [phone] and please allow 5-7 business days for completion.  Pain Management After Surgery -You will be prescribed your pain medication and bowel regimen medications before surgery so that you can have these available when you are discharged from the hospital. The pain medication is for use ONLY AFTER surgery and a new prescription will not be given.   -Make sure that you have Tylenol  IF YOU ARE ABLE TO TAKE THESE MEDICATION at home to use on a regular basis after surgery for pain control.   -Review the attached handout on narcotic use and their risks and side effects.   Bowel Regimen -It is important to prevent constipation and drink adequate amounts of liquids. You can use a laxative daily for the first several days after surgery.  Risks of Surgery Risks of surgery are low but include bleeding, infection, damage to surrounding structures, re-operation, blood clots, and very rarely death.   Blood Transfusion Information (For the consent to be signed before surgery)  We will be checking your blood type before surgery so in case of emergencies, we will know what type of blood you would need.                                            WHAT IS A BLOOD TRANSFUSION?  A transfusion is the replacement of blood or some of its parts. Blood is made up of multiple cells which provide different functions. Red blood cells carry oxygen and are used for blood loss replacement. White blood cells fight against infection. Platelets control bleeding. Plasma helps clot blood. Other blood products are available for specialized needs, such as hemophilia or other clotting disorders. BEFORE THE TRANSFUSION  Who gives blood for  transfusions?  You may be able to donate blood to be used at a later date on yourself (autologous donation). Relatives can be asked to donate blood. This is generally not any safer than if you have received blood from a stranger. The same precautions are taken to ensure safety when a relative's blood is donated. Healthy volunteers who are fully evaluated to make sure their blood is safe. This is blood bank blood. Transfusion therapy is the safest it has ever been in the practice of medicine. Before blood is taken from a donor, a complete history is taken to make sure that person has no history of diseases nor engages in risky social behavior (examples are intravenous drug use or sexual activity with multiple partners). The donor's travel history is screened to minimize risk of transmitting infections, such as malaria. The donated blood is tested for signs of infectious  diseases, such as HIV and hepatitis. The blood is then tested to be sure it is compatible with you in order to minimize the chance of a transfusion reaction. If you or a relative donates blood, this is often done in anticipation of surgery and is not appropriate for emergency situations. It takes many days to process the donated blood. RISKS AND COMPLICATIONS Although transfusion therapy is very safe and saves many lives, the main dangers of transfusion include:  Getting an infectious disease. Developing a transfusion reaction. This is an allergic reaction to something in the blood you were given. Every precaution is taken to prevent this. The decision to have a blood transfusion has been considered carefully by your caregiver before blood is given. Blood is not given unless the benefits outweigh the risks.  AFTER SURGERY INSTRUCTIONS  Return to work: 4-6 weeks if applicable  Activity: 1. Be up and out of the bed during the day.  Take a nap if needed.  You may walk up steps but be careful and use the hand rail.  Stair climbing will  tire you more than you think, you may need to stop part way and rest.   2. No lifting or straining for 6 weeks over 10 pounds. No pushing, pulling, straining for 6 weeks.  3. No driving for 4-89 days when the following criteria have been met: Do not drive if you are taking narcotic pain medicine and make sure that your reaction time has returned.   4. You can shower as soon as the next day after surgery. Shower daily.  Use your regular soap and water  (not directly on the incision) and pat your incision(s) dry afterwards; don't rub.  No tub baths or submerging your body in water  until cleared by your surgeon. If you have the soap that was given to you by pre-surgical testing that was used before surgery, you do not need to use it afterwards because this can irritate your incisions.   5. No sexual activity and nothing in the vagina for 6 weeks, 12 weeks if you have a hysterectomy (removal of the uterus and cervix).  6. You may experience a small amount of clear drainage from your incisions, which is normal.  If the drainage persists, increases, or changes color please call the office.  7. Do not use creams, lotions, or ointments such as neosporin on your incisions after surgery until advised by your surgeon because they can cause removal of the dermabond glue on your incisions.    8. You may experience vaginal spotting after surgery or when the stitches at the top of the vagina begin to dissolve (if you have a hysterectomy).  The spotting is normal but if you experience heavy bleeding, call our office.  9. Take Tylenol  first for pain if you are able to take these medication and only use narcotic pain medication for severe pain not relieved by the Tylenol .  Monitor your Tylenol  intake to a max of 4,000 mg in a 24 hour period.  Diet: 1. Low sodium Heart Healthy Diet is recommended but you are cleared to resume your normal (before surgery) diet after your procedure.  2. Keep bowel movements regular  and prevent constipation.    Wound Care: 1. Keep clean and dry.  Shower daily.  Reasons to call the Doctor: Fever - Oral temperature greater than 100.4 degrees Fahrenheit Foul-smelling vaginal discharge Difficulty urinating Nausea and vomiting Increased pain at the site of the incision that is unrelieved with pain medicine.  Difficulty breathing with or without chest pain New calf pain especially if only on one side Sudden, continuing increased vaginal bleeding with or without clots.   Contacts: For questions or concerns you should contact:  Dr. Hoy Masters at [phone]  Eleanor Epps, NP at [phone]  After Hours: call [phone] and have the GYN Oncologist paged/contacted (after 5 pm or on the weekends). You will speak with an after hours RN and let he or she know you have had surgery.  Messages sent via mychart are for non-urgent matters and are not responded to after hours so for urgent needs, please call the after hours number.
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
## 2024-04-01 ENCOUNTER — Telehealth: Payer: Self-pay | Admitting: *Deleted

## 2024-04-01 ENCOUNTER — Telehealth (HOSPITAL_COMMUNITY): Payer: Self-pay | Admitting: *Deleted

## 2024-04-01 NOTE — Telephone Encounter (Signed)
 1. Tell me about your pain and pain management?    Pt denies any pain.   2. Let's talk about fluid intake. How much total fluid are you taking in?   Pt states that s/he is getting in at least 60oz of fluid including protein shakes, bottled water     3. How much protein have you taken in the last day?    Pt states that she is working to meet the goal of 60g of protein today. Pt plans to drink the reminder of goal throughout the day to meet criteria.   4. Have you had nausea? Tell me about when you have experienced nausea and what you did to help?   Pt denies nausea.   5. Has the frequency or color changed with your urine?   Pt states that s/he is urinating fine with no changes in frequency or urgency.   6. Tell me what your incisions look like?   Incisions look fine. Pt denies a fever, chills. Pt states incisions are not swollen, open, or draining. Pt encouraged to call [facility] if incisions change.    7. Have you been passing gas? BM?   Pt states that they are having BMs.   Pt states that they have had a BM. Pt instructed to take either Miralax or MoM as instructed per Gastric Bypass/Sleeve Discharge Home Care Instructions. Pt to call surgeon's office if not able to have BM with medication.   8. If a problem or question were to arise who would you call? Do you know contact numbers for [facility], [facility], and [facility]?   Pt knows to call [facility] for surgical, [facility] for nutrition, and [facility] for non-urgent questions or concerns. Pt denies dehydration symptoms. Pt can describe s/sx of dehydration.   9. How has the walking going?   Pt states s/he is walking around and able to be active without difficulty.   10. How are your vitamins and calcium going? How are you taking them?    Pt states that s/he is taking his/her supplements and vitamins without difficulty.   11. How has the anticoagulant Lovenox  been going?   LOVENOX : Pt states that s/he is taking the Lovenox  injections without difficulty.  Reinforced education about taking injections q12h and rotating injection sites. Pt also instructed to monitor for unusual bruising and/or signs of bleeding.

## 2024-04-01 NOTE — Telephone Encounter (Signed)
-----   Message from [place], [place] sent at 03/31/2024  6:16 PM EDT ----- Regarding: ativan  Can you let the pt know that I sent in 1 tablet for ativan  for taking 30 min prior to MRI.  Thanks, [employer]

## 2024-04-01 NOTE — Telephone Encounter (Signed)
 Spoke with patient and relayed message from Dr. Eldonna that 1 tablet of ativan  has been sent to your pharmacy to take 30 minutes prior to your MRI on 8/4. Pt verbalized understanding and thanked the office for calling.

## 2024-04-02 NOTE — Progress Notes (Signed)
 Patient here for consultation with Dr. Eldonna for a pre-operative appointment prior to her scheduled surgery on 04/29/2024. She is scheduled for mini laparotomy for controlled pelvic cyst drainage, robotic assisted laparoscopic unilateral salpingo-oophorectomy, possible total hysterectomy, possible bilateral salpingo-oophorectomy, possible staging if a precancer or cancer seen. The surgery was discussed in detail.  See after visit summary for additional details.     Discussed post-op pain management in detail including the aspects of the enhanced recovery pathway.  Advised her that a new prescription would be sent in for tramadol  closer to the date and it is only to be used for after her upcoming surgery.  We discussed the use of tylenol  post-op and to monitor for a maximum of 4,000 mg in a 24 hour period.  Also will plan to prescribe sennakot to be used after surgery and to hold if having loose stools.  Discussed bowel regimen in detail.     Discussed measures to take at home to prevent DVT including frequent mobility.  Reportable signs and symptoms of DVT discussed. Post-operative instructions discussed and expectations for after surgery. Incisional care discussed as well including reportable signs and symptoms including erythema, drainage, wound separation.     10 minutes spent preparing information and with the patient.  Verbalizing understanding of material discussed. No needs or concerns voiced at the end of the visit.   Advised patient to call for any needs.    This appointment is included in the global surgical bundle as pre-operative teaching and has no charge.

## 2024-04-04 ENCOUNTER — Ambulatory Visit (HOSPITAL_COMMUNITY)
Admission: RE | Admit: 2024-04-04 | Discharge: 2024-04-04 | Disposition: A | Discharge status: Home / Self Care | Source: Ambulatory Visit | Attending: Psychiatry | Admitting: Psychiatry

## 2024-04-04 DIAGNOSIS — R978 Other abnormal tumor markers: Secondary | ICD-10-CM | POA: Insufficient documentation

## 2024-04-04 DIAGNOSIS — R1900 Intra-abdominal and pelvic swelling, mass and lump, unspecified site: Secondary | ICD-10-CM | POA: Insufficient documentation

## 2024-04-04 MED ORDER — IOHEXOL 300 MG/ML  SOLN
100.0000 mL | Freq: Once | INTRAMUSCULAR | Status: AC | PRN
  Administered 2024-04-04: 100 mL via INTRAVENOUS

## 2024-04-07 ENCOUNTER — Ambulatory Visit (HOSPITAL_COMMUNITY)
Admission: RE | Admit: 2024-04-07 | Discharge: 2024-04-07 | Disposition: A | Discharge status: Home / Self Care | Source: Ambulatory Visit | Attending: Psychiatry | Admitting: Psychiatry

## 2024-04-07 ENCOUNTER — Encounter: Payer: Self-pay | Admitting: Family Medicine

## 2024-04-07 DIAGNOSIS — R978 Other abnormal tumor markers: Secondary | ICD-10-CM | POA: Insufficient documentation

## 2024-04-07 DIAGNOSIS — R1900 Intra-abdominal and pelvic swelling, mass and lump, unspecified site: Secondary | ICD-10-CM | POA: Insufficient documentation

## 2024-04-07 MED ORDER — GADOBUTROL 1 MMOL/ML IV SOLN
10.0000 mL | Freq: Once | INTRAVENOUS | Status: AC | PRN
  Administered 2024-04-07: 10 mL via INTRAVENOUS

## 2024-04-08 ENCOUNTER — Encounter: Payer: Self-pay | Admitting: Dietician

## 2024-04-08 ENCOUNTER — Encounter: Attending: Surgery | Admitting: Dietician

## 2024-04-08 VITALS — Ht 63.0 in | Wt 231.5 lb

## 2024-04-08 DIAGNOSIS — E669 Obesity, unspecified: Secondary | ICD-10-CM | POA: Insufficient documentation

## 2024-04-08 NOTE — Progress Notes (Signed)
 2 Week Post-Operative Nutrition Class   Patient was seen on 04/08/2024 for Post-Operative Nutrition education at [facility]    Surgery date: 03/25/2024 Surgery type: RYGB  Anthropometrics  Start weight at [facility]: 256.8 lbs (date: 12/03/2023)  Height: 63 in Weight today: 231.5 lbs   Clinical  Medical hx: hypercholesterolemia, asthma, HTN, obesity Medications: Qulipta  for migraines, albuterol , Advair, and vitamin D3, valsartan, metoprolol , amlodipine , and hydrochlorothiazide , Ozempic, asthma, trazodone (insomnia) Labs: A1c 6.1; iron saturation 9; triglycerides 336; HDL 30; VLDL 55; glucose 108 (fasting per pt); hemoglobin 10.9; hematocrit 33.5 Notable signs/symptoms: none noted Any previous deficiencies? No Bowel Habits: Every day to every other day no complaints   Body Composition Scale 04/08/2024  Current Body Weight 231.5  Total Body Fat % 43.7  Visceral Fat 14  Fat-Free Mass % 56.2   Total Body Water  % 42.6  Muscle-Mass lbs 31.3  BMI 40.6  Body Fat Displacement          Torso  lbs 62.7         Left Leg  lbs 12.5         Right Leg  lbs 12.5         Left Arm  lbs 6.2         Right Arm  lbs 6.2    The following the learning objectives were met by the patient during this course: Identifies Soft Prepped Plan Advancement Guide  Identifies Soft, High Proteins (Phase 1), beginning 2 weeks post-operatively to 3 weeks post-operatively Identifies Additional Soft High Proteins, soft non-starchy vegetables, fruits and starches (Phase 2), beginning 3 weeks post-operatively to 3 months post-operatively Identifies appropriate sources of fluids, proteins, vegetables, fruits and starches Identifies appropriate fat sources and healthy verses unhealthy fat types   States protein, vegetable, fruit and starch recommendations and appropriate sources post-operatively Identifies the need for appropriate texture modifications, mastication, and bite sizes when  consuming solids Identifies appropriate fat consumption and sources Identifies appropriate multivitamin and calcium sources post-operatively Describes the need for physical activity post-operatively and will follow MD recommendations States when to call healthcare provider regarding medication questions or post-operative complications   Handouts given during class include: Soft Prepped Plan Advancement Guide   Follow-Up Plan: Patient will follow-up at [facility] in 10 weeks for 3 month post-op nutrition visit for diet advancement per MD.

## 2024-04-14 ENCOUNTER — Encounter: Payer: Self-pay | Admitting: Surgery

## 2024-04-14 ENCOUNTER — Encounter: Payer: Self-pay | Admitting: Pulmonary Disease

## 2024-04-14 ENCOUNTER — Ambulatory Visit (INDEPENDENT_AMBULATORY_CARE_PROVIDER_SITE_OTHER): Admitting: Pulmonary Disease

## 2024-04-14 ENCOUNTER — Ambulatory Visit (INDEPENDENT_AMBULATORY_CARE_PROVIDER_SITE_OTHER)

## 2024-04-14 VITALS — BP 112/77 | HR 96 | Temp 98.2°F | Ht 63.0 in | Wt 228.0 lb

## 2024-04-14 DIAGNOSIS — J4520 Mild intermittent asthma, uncomplicated: Secondary | ICD-10-CM

## 2024-04-14 LAB — CBC WITH DIFFERENTIAL/PLATELET
Basophils Absolute: 0 K/uL (ref 0.0–0.1)
Basophils Relative: 0.9 % (ref 0.0–3.0)
Eosinophils Absolute: 0.1 K/uL (ref 0.0–0.7)
Eosinophils Relative: 2 % (ref 0.0–5.0)
HCT: 36.9 % (ref 36.0–46.0)
Hemoglobin: 11.9 g/dL — ABNORMAL LOW (ref 12.0–15.0)
Lymphocytes Relative: 53.4 % — ABNORMAL HIGH (ref 12.0–46.0)
Lymphs Abs: 2.2 K/uL (ref 0.7–4.0)
MCHC: 32.2 g/dL (ref 30.0–36.0)
MCV: 76.6 fl — ABNORMAL LOW (ref 78.0–100.0)
Monocytes Absolute: 0.4 K/uL (ref 0.1–1.0)
Monocytes Relative: 10.8 % (ref 3.0–12.0)
Neutro Abs: 1.4 K/uL (ref 1.4–7.7)
Neutrophils Relative %: 32.9 % — ABNORMAL LOW (ref 43.0–77.0)
Platelets: 476 K/uL — ABNORMAL HIGH (ref 150.0–400.0)
RBC: 4.82 Mil/uL (ref 3.87–5.11)
RDW: 16.5 % — ABNORMAL HIGH (ref 11.5–15.5)
WBC: 4.1 K/uL (ref 4.0–10.5)

## 2024-04-14 LAB — NITRIC OXIDE: Nitric Oxide: 24

## 2024-04-14 MED ORDER — AIRSUPRA 90-80 MCG/ACT IN AERO: 2.0000 | INHALATION_SPRAY | RESPIRATORY_TRACT | 11 refills | Status: AC | PRN
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
## 2024-04-14 NOTE — Patient Instructions (Signed)
  VISIT SUMMARY: Today, you came in to establish pulmonary care for your asthma. We discussed your history of asthma, recent exacerbations, and current medications. We also reviewed your recent respiratory illnesses, surgical history, and weight loss following your gastric bypass surgery. We have developed a plan to help manage your asthma more effectively and monitor your overall health.  YOUR PLAN: -ASTHMA: Asthma is a condition where your airways narrow and swell, producing extra mucus, which can make breathing difficult. Your asthma has been mild with infrequent flare-ups, but you experienced significant exacerbations after COVID-19 infections. We will prescribe Airsupra , a new rescue inhaler that contains both albuterol  and a steroid, to help control your symptoms. We will also order baseline blood tests to check your allergy profile and inflammation, a chest x-ray for a baseline assessment, and plan for a pulmonary function test in a couple of months.  -OBESITY, POST-BARIATRIC SURGERY: Obesity is a condition where you have an excessive amount of body fat. You have lost about 16 pounds since your gastric bypass surgery in June, which is expected to improve your asthma control and potential sleep apnea symptoms. We will monitor your weight loss progress and consider sleep apnea testing once your weight stabilizes if symptoms persist.  INSTRUCTIONS: Please follow up with the baseline blood tests and chest x-ray as ordered. We will plan for a pulmonary function test in a couple of months. Continue to monitor your weight loss progress and let us  know if you experience any persistent symptoms that may suggest sleep apnea.                      Contains text generated by Abridge.                                 Contains text generated by Abridge.

## 2024-04-15 ENCOUNTER — Telehealth: Payer: Self-pay | Admitting: Psychiatry

## 2024-04-15 NOTE — Patient Instructions (Addendum)
 SURGICAL WAITING ROOM VISITATION Patients having surgery or a procedure may have no more than 2 support people in the waiting area - these visitors may rotate in the visitor waiting room.   Due to an increase in RSV and influenza rates and associated hospitalizations, children ages 78 and under may not visit patients in [facility] hospitals. If the patient needs to stay at the hospital during part of their recovery, the visitor guidelines for inpatient rooms apply.  PRE-OP VISITATION  Pre-op nurse will coordinate an appropriate time for 1 support person to accompany the patient in pre-op.  This support person may not rotate.  This visitor will be contacted when the time is appropriate for the visitor to come back in the pre-op area.  Please refer to the [facility] website for the visitor guidelines for Inpatients (after your surgery is over and you are in a regular room).  You are not required to quarantine at this time prior to your surgery. However, you must do this: Hand Hygiene often Do NOT share personal items Notify your provider if you are in close contact with someone who has COVID or you develop fever 100.4 or greater, new onset of sneezing, cough, sore throat, shortness of breath or body aches.  If you test positive for Covid or have been in contact with anyone that has tested positive in the last 10 days please notify you surgeon.    Your procedure is scheduled on:  04/29/24  Report to [facility] Main Entrance: [place] entrance where the [employer] is available.   Report to admitting at: 11:15 AM  Call this number if you have any questions or problems the morning of surgery [phone]  FOLLOW ANY ADDITIONAL PRE OP INSTRUCTIONS YOU RECEIVED FROM YOUR SURGEON'S OFFICE!!!  Eat a light diet the day before surgery.  Examples including soups, broths, toast, yogurt, mashed potatoes.  Things to avoid include carbonated beverages (fizzy beverages), raw fruits and raw  vegetables, or beans.   If your bowels are filled with gas, your surgeon will have difficulty visualizing your pelvic organs which increases your surgical risks.  Do not eat food after Midnight the night prior to your surgery/procedure.  After Midnight you may have the following liquids until: 10:30 AM DAY OF SURGERY  Clear Liquid Diet Water  Black Coffee (sugar ok, NO MILK/CREAM OR CREAMERS)  Tea (sugar ok, NO MILK/CREAM OR CREAMERS) regular and decaf                             Plain Jell-O  with no fruit (NO RED)                                           Fruit ices (not with fruit pulp, NO RED)                                     Popsicles (NO RED)  Juice: NO CITRUS JUICES: only apple, WHITE grape, WHITE cranberry Sports drinks like Gatorade or Powerade (NO RED)   Oral Hygiene is also important to reduce your risk of infection.        Remember - BRUSH YOUR TEETH THE MORNING OF SURGERY WITH YOUR REGULAR TOOTHPASTE  Do NOT smoke after Midnight the night before surgery.  STOP TAKING all Vitamins, Herbs and supplements 1 week before your surgery.   Take ONLY these medicines the morning of surgery with A SIP OF WATER : metoprolol ,amlodipine ,qulipta ,pantoprazole .Use inhalers as usual and bring them.  If You have been diagnosed with Sleep Apnea - Bring CPAP mask and tubing day of surgery. We will provide you with a CPAP machine on the day of your surgery.                   You may not have any metal on your body including hair pins, jewelry, and body piercing  Do not wear make-up, lotions, powders, perfumes / cologne, or deodorant  Do not wear nail polish including gel and S&S, artificial / acrylic nails, or any other type of covering on natural nails including finger and toenails. If you have artificial nails, gel coating, etc., that needs to be removed by a nail salon, Please have this removed prior to surgery. Not doing so  may mean that your surgery could be cancelled or delayed if the Surgeon or anesthesia staff feels like they are unable to monitor you safely.   Do not shave 48 hours prior to surgery to avoid nicks in your skin which may contribute to postoperative infections.   Contacts, Hearing Aids, dentures or bridgework may not be worn into surgery. DENTURES WILL BE REMOVED PRIOR TO SURGERY PLEASE DO NOT APPLY Poly grip OR ADHESIVES!!!  You may bring a small overnight bag with you on the day of surgery, only pack items that are not valuable. [facility] IS NOT RESPONSIBLE   FOR VALUABLES THAT ARE LOST OR STOLEN.   Patients discharged on the day of surgery will not be allowed to drive home.  Someone NEEDS to stay with you for the first 24 hours after anesthesia.  Do not bring your home medications to the hospital. The Pharmacy will dispense medications listed on your medication list to you during your admission in the Hospital.  Special Instructions: Bring a copy of your healthcare power of attorney and living will documents the day of surgery, if you wish to have them scanned into your [facility]- EPIC  Please read over the following fact sheets you were given: IF YOU HAVE QUESTIONS ABOUT YOUR PRE-OP INSTRUCTIONS, PLEASE CALL [phone]  [facility] - Preparing for Surgery Before surgery, you can play an important role.  Because skin is not sterile, your skin needs to be as free of germs as possible.  You can reduce the number of germs on your skin by washing with CHG (chlorahexidine gluconate) soap before surgery.  CHG is an antiseptic cleaner which kills germs and bonds with the skin to continue killing germs even after washing. Please DO NOT use if you have an allergy to CHG or antibacterial soaps.  If your skin becomes reddened/irritated stop using the CHG and inform your nurse when you arrive at Short Stay. Do not shave (including legs and underarms) for at least 48 hours prior to the  first CHG shower.  You may shave your face/neck.  Please follow these instructions carefully:  1.  Shower with CHG Soap the  night before surgery and the  morning of surgery.  2.  If you choose to wash your hair, wash your hair first as usual with your normal  shampoo.  3.  After you shampoo, rinse your hair and body thoroughly to remove the shampoo.                             4.  Use CHG as you would any other liquid soap.  You can apply chg directly to the skin and wash.  Gently with a scrungie or clean washcloth.  5.  Apply the CHG Soap to your body ONLY FROM THE NECK DOWN.   Do not use on face/ open                           Wound or open sores. Avoid contact with eyes, ears mouth and genitals (private parts).                       Wash face,  Genitals (private parts) with your normal soap.             6.  Wash thoroughly, paying special attention to the area where your  surgery  will be performed.  7.  Thoroughly rinse your body with warm water  from the neck down.  8.  DO NOT shower/wash with your normal soap after using and rinsing off the CHG Soap.            9.  Pat yourself dry with a clean towel.            10.  Wear clean pajamas.            11.  Place clean sheets on your bed the night of your first shower and do not  sleep with pets.  ON THE DAY OF SURGERY : Do not apply any lotions/deodorants the morning of surgery.  Please wear clean clothes to the hospital/surgery center.     FAILURE TO FOLLOW THESE INSTRUCTIONS MAY RESULT IN THE CANCELLATION OF YOUR SURGERY  PATIENT SIGNATURE_________________________________  NURSE SIGNATURE__________________________________  ________________________________________________________________________   WHAT IS A BLOOD TRANSFUSION? Blood Transfusion Information  A transfusion is the replacement of blood or some of its parts. Blood is made up of multiple cells which provide different functions. Red blood cells carry oxygen and are used  for blood loss replacement. White blood cells fight against infection. Platelets control bleeding. Plasma helps clot blood. Other blood products are available for specialized needs, such as hemophilia or other clotting disorders. BEFORE THE TRANSFUSION  Who gives blood for transfusions?  Healthy volunteers who are fully evaluated to make sure their blood is safe. This is blood bank blood. Transfusion therapy is the safest it has ever been in the practice of medicine. Before blood is taken from a donor, a complete history is taken to make sure that person has no history of diseases nor engages in risky social behavior (examples are intravenous drug use or sexual activity with multiple partners). The donor's travel history is screened to minimize risk of transmitting infections, such as malaria. The donated blood is tested for signs of infectious diseases, such as HIV and hepatitis. The blood is then tested to be sure it is compatible with you in order to minimize the chance of a transfusion reaction. If you or a relative donates blood, this is often done in  anticipation of surgery and is not appropriate for emergency situations. It takes many days to process the donated blood. RISKS AND COMPLICATIONS Although transfusion therapy is very safe and saves many lives, the main dangers of transfusion include:  Getting an infectious disease. Developing a transfusion reaction. This is an allergic reaction to something in the blood you were given. Every precaution is taken to prevent this. The decision to have a blood transfusion has been considered carefully by your caregiver before blood is given. Blood is not given unless the benefits outweigh the risks. AFTER THE TRANSFUSION Right after receiving a blood transfusion, you will usually feel much better and more energetic. This is especially true if your red blood cells have gotten low (anemic). The transfusion raises the level of the red blood cells which  carry oxygen, and this usually causes an energy increase. The nurse administering the transfusion will monitor you carefully for complications. HOME CARE INSTRUCTIONS  No special instructions are needed after a transfusion. You may find your energy is better. Speak with your caregiver about any limitations on activity for underlying diseases you may have. SEEK MEDICAL CARE IF:  Your condition is not improving after your transfusion. You develop redness or irritation at the intravenous (IV) site. SEEK IMMEDIATE MEDICAL CARE IF:  Any of the following symptoms occur over the next 12 hours: Shaking chills. You have a temperature by mouth above 102 F (38.9 C), not controlled by medicine. Chest, back, or muscle pain. People around you feel you are not acting correctly or are confused. Shortness of breath or difficulty breathing. Dizziness and fainting. You get a rash or develop hives. You have a decrease in urine output. Your urine turns a dark color or changes to pink, red, or brown. Any of the following symptoms occur over the next 10 days: You have a temperature by mouth above 102 F (38.9 C), not controlled by medicine. Shortness of breath. Weakness after normal activity. The white part of the eye turns yellow (jaundice). You have a decrease in the amount of urine or are urinating less often. Your urine turns a dark color or changes to pink, red, or brown. Document Released: 08/18/2000 Document Revised: 11/13/2011 Document Reviewed: 04/06/2008 [facility] Patient Information 2014 [place], [place].  _______________________________________________________________________

## 2024-04-15 NOTE — Telephone Encounter (Signed)
 Called pt with imaging results. All questions answered.

## 2024-04-16 ENCOUNTER — Encounter (HOSPITAL_COMMUNITY): Payer: Self-pay

## 2024-04-16 ENCOUNTER — Encounter (HOSPITAL_COMMUNITY)
Admission: RE | Admit: 2024-04-16 | Discharge: 2024-04-16 | Disposition: A | Discharge status: Home / Self Care | Source: Ambulatory Visit | Attending: Psychiatry | Admitting: Psychiatry

## 2024-04-16 ENCOUNTER — Other Ambulatory Visit: Payer: Self-pay

## 2024-04-16 ENCOUNTER — Other Ambulatory Visit: Payer: Self-pay | Admitting: Gynecologic Oncology

## 2024-04-16 DIAGNOSIS — R971 Elevated cancer antigen 125 [CA 125]: Secondary | ICD-10-CM | POA: Insufficient documentation

## 2024-04-16 DIAGNOSIS — R7989 Other specified abnormal findings of blood chemistry: Secondary | ICD-10-CM

## 2024-04-16 DIAGNOSIS — R1900 Intra-abdominal and pelvic swelling, mass and lump, unspecified site: Secondary | ICD-10-CM | POA: Diagnosis not present

## 2024-04-16 DIAGNOSIS — Z01812 Encounter for preprocedural laboratory examination: Secondary | ICD-10-CM | POA: Insufficient documentation

## 2024-04-16 HISTORY — DX: Prediabetes: R73.03

## 2024-04-16 LAB — COMPREHENSIVE METABOLIC PANEL WITH GFR
ALT: 20 U/L (ref 0–44)
AST: 19 U/L (ref 15–41)
Albumin: 4.4 g/dL (ref 3.5–5.0)
Alkaline Phosphatase: 61 U/L (ref 38–126)
Anion gap: 14 (ref 5–15)
BUN: 25 mg/dL — ABNORMAL HIGH (ref 6–20)
CO2: 23 mmol/L (ref 22–32)
Calcium: 9.6 mg/dL (ref 8.9–10.3)
Chloride: 97 mmol/L — ABNORMAL LOW (ref 98–111)
Creatinine, Ser: 1.31 mg/dL — ABNORMAL HIGH (ref 0.44–1.00)
GFR, Estimated: 52 mL/min — ABNORMAL LOW (ref >60)
Glucose, Bld: 79 mg/dL (ref 70–99)
Potassium: 3.6 mmol/L (ref 3.5–5.1)
Sodium: 134 mmol/L — ABNORMAL LOW (ref 135–145)
Total Bilirubin: 0.8 mg/dL (ref 0.0–1.2)
Total Protein: 7.9 g/dL (ref 6.5–8.1)

## 2024-04-16 LAB — CBC
HCT: 37.7 % (ref 36.0–46.0)
Hemoglobin: 11.5 g/dL — ABNORMAL LOW (ref 12.0–15.0)
MCH: 24.5 pg — ABNORMAL LOW (ref 26.0–34.0)
MCHC: 30.5 g/dL (ref 30.0–36.0)
MCV: 80.4 fL (ref 80.0–100.0)
Platelets: 446 K/uL — ABNORMAL HIGH (ref 150–400)
RBC: 4.69 MIL/uL (ref 3.87–5.11)
RDW: 16.3 % — ABNORMAL HIGH (ref 11.5–15.5)
WBC: 5.2 K/uL (ref 4.0–10.5)
nRBC: 0 % (ref 0.0–0.2)

## 2024-04-16 NOTE — Progress Notes (Addendum)
 For Anesthesia: PCP - Alben Therisa MATSU, PA  Cardiologist - N/A Pulmonologist: Mannam, Praveen, MD : Clearance. Bowel Prep reminder:  Chest x-ray - 04/14/24 EKG - 12/11/23 Stress Test -  ECHO -  Cardiac Cath -  Pacemaker/ICD device last checked: Pacemaker orders received: Device Rep notified:  Spinal Cord Stimulator:N/A  Sleep Study - N/A CPAP -   Fasting Blood Sugar - N/A Checks Blood Sugar _____ times a day Date and result of last Hgb A1c-  Last dose of GLP1 agonist- semaglutide: it's been on hole since 03/02/24 GLP1 instructions:   Last dose of SGLT-2 inhibitors- N/A SGLT-2 instructions:   Blood Thinner Instructions:N/A Aspirin Instructions: Last Dose:  Activity level: Can go up a flight of stairs and activities of daily living without stopping and without chest pain and/or shortness of breath   Able to exercise without chest pain and/or shortness of breath  Anesthesia review: Hx:HTN,Pre-DIA  Patient denies shortness of breath, fever, cough and chest pain at PAT appointment   Patient verbalized understanding of instructions that were reviewed over the telephone.

## 2024-04-17 ENCOUNTER — Telehealth: Payer: Self-pay | Admitting: *Deleted

## 2024-04-17 ENCOUNTER — Telehealth: Payer: Self-pay

## 2024-04-17 ENCOUNTER — Telehealth: Payer: Self-pay | Admitting: Dietician

## 2024-04-17 DIAGNOSIS — E669 Obesity, unspecified: Secondary | ICD-10-CM

## 2024-04-17 LAB — IGE: IgE (Immunoglobulin E), Serum: 74 kU/L (ref <=114)

## 2024-04-17 NOTE — Telephone Encounter (Signed)
-----   Message from Eleanor JONETTA Epps sent at 04/16/2024  4:21 PM EDT ----- On this patient's preop labs, her creatinine was markedly elevated at 1.31. It was 0.91 at the end of July. Has she been sick recently? Staying hydrated. This will need to be repeated. We can do this on Friday afternoon or Monday am. Would avoid NSAIDS and stay hydrated.

## 2024-04-17 NOTE — Telephone Encounter (Signed)
 RD called pt to verify fluid intake once starting soft, solid proteins 2 week post-bariatric surgery.   Daily Fluid intake: 32-64 oz Daily Protein intake: 60 grams this week Bowel Habits: no issues until today; taking fiber con, but states she will try Miralax  Concerns/issues: not going so well; took a step back, and went back to liquids; mass surgery also; no energy last week at all  ...more energy today and trying more food now. Pt states she is doing better and states she will call next week to follow-up, and keep pushing fluids, adding flavorings to water .

## 2024-04-17 NOTE — Telephone Encounter (Signed)
Received medical clearance

## 2024-04-17 NOTE — Telephone Encounter (Signed)
 Per Eleanor Epps NP I reached out to Ms Rosenau regarding recent labs. She is aware of results and needing a follow up lab appointment.  Lab appointment scheduled for 8/18 @ 8:30 she agreed to date and time.   Pt reports no recent sickness she thinks she is staying hydrated but will drink more. She states she had bariatric surgery on 7/22 and wonders if labs could be elevated from that.   Also, at pre-admit appointment yesterday she was told to stop all vitamins and supplements 1 week before surgery. Do to having recent bariatric surgery she is supposed to take calcium once daily and multivitamin 2 x daily. Per Eleanor Epps that is ok to continue.

## 2024-04-19 ENCOUNTER — Ambulatory Visit: Payer: Self-pay | Admitting: Pulmonary Disease

## 2024-04-21 ENCOUNTER — Ambulatory Visit: Admitting: Psychiatry

## 2024-04-21 ENCOUNTER — Ambulatory Visit: Payer: Self-pay

## 2024-04-21 ENCOUNTER — Inpatient Hospital Stay: Attending: Psychiatry

## 2024-04-21 DIAGNOSIS — R1900 Intra-abdominal and pelvic swelling, mass and lump, unspecified site: Secondary | ICD-10-CM | POA: Diagnosis present

## 2024-04-21 DIAGNOSIS — R7989 Other specified abnormal findings of blood chemistry: Secondary | ICD-10-CM

## 2024-04-21 LAB — BASIC METABOLIC PANEL WITH GFR
Anion gap: 7 (ref 5–15)
BUN: 13 mg/dL (ref 6–20)
CO2: 30 mmol/L (ref 22–32)
Calcium: 9.4 mg/dL (ref 8.9–10.3)
Chloride: 102 mmol/L (ref 98–111)
Creatinine, Ser: 1.08 mg/dL — ABNORMAL HIGH (ref 0.44–1.00)
GFR, Estimated: >60 mL/min (ref >60)
Glucose, Bld: 104 mg/dL — ABNORMAL HIGH (ref 70–99)
Potassium: 3.7 mmol/L (ref 3.5–5.1)
Sodium: 139 mmol/L (ref 135–145)

## 2024-04-21 NOTE — Telephone Encounter (Signed)
-----   Message from Eleanor JONETTA Epps sent at 04/21/2024  9:29 AM EDT ----- Please let her know her creatinine is heading in the right direction. It is still elevated at 1.08 with range 0.44-1.0 but is almost back to higher normal range. Would continue to stay hydrated.  Avoid NSAIDS.   Please also fax results to patient's PCP since she is on several BP meds. ----- Message ----- From: Rebecka, [facility] [place] Sent: 04/21/2024   9:13 AM EDT To: Eleanor JONETTA Epps, NP

## 2024-04-21 NOTE — Telephone Encounter (Signed)
 Amy Ferguson is aware of recent lab results as reported by Eleanor Epps NP.   Results faxed to PCP Therisa Holm PA

## 2024-04-28 ENCOUNTER — Telehealth: Payer: Self-pay | Admitting: *Deleted

## 2024-04-28 ENCOUNTER — Other Ambulatory Visit: Payer: Self-pay | Admitting: Psychiatry

## 2024-04-28 MED ORDER — SENNOSIDES-DOCUSATE SODIUM 8.6-50 MG PO TABS: 1.0000 | ORAL_TABLET | Freq: Every day | ORAL | 0 refills | Status: AC

## 2024-04-28 MED ORDER — TRAMADOL HCL 50 MG PO TABS: 50.0000 mg | ORAL_TABLET | Freq: Four times a day (QID) | ORAL | 0 refills | Status: DC | PRN

## 2024-04-28 NOTE — Anesthesia Preprocedure Evaluation (Signed)
 Anesthesia Evaluation  Patient identified by MRN, date of birth, ID band Patient awake    Reviewed: Allergy & Precautions, NPO status , Patient's Chart, lab work & pertinent test results  History of Anesthesia Complications Negative for: history of anesthetic complications  Airway Mallampati: II  TM Distance: >3 FB Neck ROM: Full    Dental no notable dental hx. (+) Teeth Intact, Dental Advisory Given   Pulmonary asthma    Pulmonary exam normal breath sounds clear to auscultation       Cardiovascular hypertension, Pt. on medications (-) angina (-) Past MI Normal cardiovascular exam Rhythm:Regular Rate:Normal     Neuro/Psych  Headaches PSYCHIATRIC DISORDERS Anxiety Depression       GI/Hepatic ,GERD  Medicated and Controlled,,  Endo/Other    Class 3 obesity  Renal/GU Lab Results      Component                Value               Date                     K                        3.7                 04/21/2024                CO2                      30                  04/21/2024                BUN                      13                  04/21/2024                CREATININE               1.08 (H)            04/21/2024                     GLUCOSE                  104 (H)             04/21/2024                Musculoskeletal   Abdominal   Peds  Hematology Lab Results      Component                Value               Date                      WBC                      5.2                 04/16/2024                HGB  11.5 (L)            04/16/2024                HCT                      37.7                04/16/2024                MCV                      80.4                04/16/2024                PLT                      446 (H)             04/16/2024              Anesthesia Other Findings All Labetolol, NSAIDS (S/P Bariatric surgery)  Reproductive/Obstetrics negative OB ROS                               Anesthesia Physical Anesthesia Plan  ASA: 3  Anesthesia Plan: General   Post-op Pain Management: Ketamine  IV*, Ofirmev  IV (intra-op)* and Lidocaine  infusion*   Induction: Intravenous  PONV Risk Score and Plan: 4 or greater and Midazolam , Dexamethasone , Ondansetron  and Treatment may vary due to age or medical condition  Airway Management Planned: Oral ETT  Additional Equipment: None  Intra-op Plan:   Post-operative Plan: Extubation in OR  Informed Consent: I have reviewed the patients History and Physical, chart, labs and discussed the procedure including the risks, benefits and alternatives for the proposed anesthesia with the patient or authorized representative who has indicated his/her understanding and acceptance.     Dental advisory given  Plan Discussed with: CRNA and Surgeon  Anesthesia Plan Comments:          Anesthesia Quick Evaluation

## 2024-04-28 NOTE — Telephone Encounter (Signed)
Telephone call to check on pre-operative status.  Patient compliant with pre-operative instructions.  Reinforced nothing to eat after midnight. Clear liquids until 1015. Patient to arrive at 1115.  No questions or concerns voiced.  Instructed to call for any needs.

## 2024-04-29 ENCOUNTER — Ambulatory Visit (HOSPITAL_COMMUNITY): Payer: Self-pay | Admitting: Medical

## 2024-04-29 ENCOUNTER — Other Ambulatory Visit: Payer: Self-pay

## 2024-04-29 ENCOUNTER — Encounter (HOSPITAL_COMMUNITY): Payer: Self-pay | Admitting: Psychiatry

## 2024-04-29 ENCOUNTER — Ambulatory Visit (HOSPITAL_BASED_OUTPATIENT_CLINIC_OR_DEPARTMENT_OTHER): Payer: Self-pay

## 2024-04-29 ENCOUNTER — Ambulatory Visit (HOSPITAL_COMMUNITY)
Admission: RE | Admit: 2024-04-29 | Discharge: 2024-04-29 | Disposition: A | Discharge status: Home / Self Care | Attending: Psychiatry | Admitting: Psychiatry

## 2024-04-29 ENCOUNTER — Encounter (HOSPITAL_COMMUNITY)
Admission: RE | Disposition: A | Discharge status: Home / Self Care | Payer: Self-pay | Source: Home / Self Care | Attending: Psychiatry

## 2024-04-29 DIAGNOSIS — Z79899 Other long term (current) drug therapy: Secondary | ICD-10-CM | POA: Insufficient documentation

## 2024-04-29 DIAGNOSIS — D259 Leiomyoma of uterus, unspecified: Secondary | ICD-10-CM | POA: Diagnosis not present

## 2024-04-29 DIAGNOSIS — D4959 Neoplasm of unspecified behavior of other genitourinary organ: Secondary | ICD-10-CM | POA: Diagnosis not present

## 2024-04-29 DIAGNOSIS — Z6841 Body Mass Index (BMI) 40.0 and over, adult: Secondary | ICD-10-CM | POA: Diagnosis not present

## 2024-04-29 DIAGNOSIS — J45909 Unspecified asthma, uncomplicated: Secondary | ICD-10-CM | POA: Diagnosis not present

## 2024-04-29 DIAGNOSIS — R971 Elevated cancer antigen 125 [CA 125]: Secondary | ICD-10-CM | POA: Diagnosis present

## 2024-04-29 DIAGNOSIS — D3912 Neoplasm of uncertain behavior of left ovary: Secondary | ICD-10-CM

## 2024-04-29 DIAGNOSIS — Z8249 Family history of ischemic heart disease and other diseases of the circulatory system: Secondary | ICD-10-CM | POA: Insufficient documentation

## 2024-04-29 DIAGNOSIS — R1900 Intra-abdominal and pelvic swelling, mass and lump, unspecified site: Secondary | ICD-10-CM | POA: Diagnosis not present

## 2024-04-29 DIAGNOSIS — F418 Other specified anxiety disorders: Secondary | ICD-10-CM | POA: Diagnosis not present

## 2024-04-29 DIAGNOSIS — I10 Essential (primary) hypertension: Secondary | ICD-10-CM | POA: Diagnosis not present

## 2024-04-29 DIAGNOSIS — Z01818 Encounter for other preprocedural examination: Secondary | ICD-10-CM

## 2024-04-29 DIAGNOSIS — E66813 Obesity, class 3: Secondary | ICD-10-CM | POA: Insufficient documentation

## 2024-04-29 DIAGNOSIS — K219 Gastro-esophageal reflux disease without esophagitis: Secondary | ICD-10-CM | POA: Diagnosis not present

## 2024-04-29 DIAGNOSIS — N839 Noninflammatory disorder of ovary, fallopian tube and broad ligament, unspecified: Secondary | ICD-10-CM

## 2024-04-29 HISTORY — PX: ROBOTIC ASSISTED SALPINGO OOPHERECTOMY: SHX6082

## 2024-04-29 LAB — POCT PREGNANCY, URINE: Preg Test, Ur: NEGATIVE

## 2024-04-29 SURGERY — SALPINGO-OOPHORECTOMY, ROBOT-ASSISTED
Anesthesia: General | Laterality: Bilateral

## 2024-04-29 MED ORDER — STERILE WATER FOR IRRIGATION IR SOLN
Status: DC | PRN
  Administered 2024-04-29: 1000 mL

## 2024-04-29 MED ORDER — ROCURONIUM BROMIDE 10 MG/ML (PF) SYRINGE
PREFILLED_SYRINGE | INTRAVENOUS | Status: AC
  Filled 2024-04-29: qty 10

## 2024-04-29 MED ORDER — LIDOCAINE HCL (PF) 2 % IJ SOLN
INTRAMUSCULAR | Status: DC | PRN
  Administered 2024-04-29: 1.5 mg/kg/h via INTRADERMAL

## 2024-04-29 MED ORDER — SCOPOLAMINE 1 MG/3DAYS TD PT72
1.0000 | MEDICATED_PATCH | TRANSDERMAL | Status: DC
  Administered 2024-04-29: 1 mg via TRANSDERMAL
  Filled 2024-04-29: qty 1

## 2024-04-29 MED ORDER — HYDROMORPHONE HCL 1 MG/ML IJ SOLN
INTRAMUSCULAR | Status: AC
  Filled 2024-04-29: qty 1

## 2024-04-29 MED ORDER — ROCURONIUM BROMIDE 100 MG/10ML IV SOLN
INTRAVENOUS | Status: DC | PRN
  Administered 2024-04-29: 20 mg via INTRAVENOUS
  Administered 2024-04-29 (×2): 10 mg via INTRAVENOUS
  Administered 2024-04-29: 70 mg via INTRAVENOUS
  Administered 2024-04-29: 10 mg via INTRAVENOUS

## 2024-04-29 MED ORDER — ONDANSETRON HCL 4 MG/2ML IJ SOLN
INTRAMUSCULAR | Status: DC | PRN
  Administered 2024-04-29: 4 mg via INTRAVENOUS

## 2024-04-29 MED ORDER — KETAMINE HCL 50 MG/5ML IJ SOSY
PREFILLED_SYRINGE | INTRAMUSCULAR | Status: DC | PRN
  Administered 2024-04-29: 10 mg via INTRAVENOUS
  Administered 2024-04-29: 20 mg via INTRAVENOUS

## 2024-04-29 MED ORDER — ACETAMINOPHEN 500 MG PO TABS
1000.0000 mg | ORAL_TABLET | ORAL | Status: AC
  Administered 2024-04-29: 1000 mg via ORAL
  Filled 2024-04-29: qty 2

## 2024-04-29 MED ORDER — LACTATED RINGERS IR SOLN
Status: DC | PRN
  Administered 2024-04-29: 1000 mL

## 2024-04-29 MED ORDER — BUPIVACAINE LIPOSOME 1.3 % IJ SUSP
INTRAMUSCULAR | Status: DC | PRN
  Administered 2024-04-29: 20 mL

## 2024-04-29 MED ORDER — HEPARIN SODIUM (PORCINE) 5000 UNIT/ML IJ SOLN
5000.0000 [IU] | INTRAMUSCULAR | Status: AC
  Administered 2024-04-29: 5000 [IU] via SUBCUTANEOUS
  Filled 2024-04-29: qty 1

## 2024-04-29 MED ORDER — PROPOFOL 1000 MG/100ML IV EMUL
INTRAVENOUS | Status: AC
Start: 2024-04-29 — End: 2024-04-29
  Filled 2024-04-29: qty 100

## 2024-04-29 MED ORDER — KETAMINE HCL 50 MG/5ML IJ SOSY
PREFILLED_SYRINGE | INTRAMUSCULAR | Status: AC
  Filled 2024-04-29: qty 5

## 2024-04-29 MED ORDER — HYDROMORPHONE HCL 2 MG/ML IJ SOLN
INTRAMUSCULAR | Status: AC
  Filled 2024-04-29: qty 1

## 2024-04-29 MED ORDER — HYDROMORPHONE HCL 1 MG/ML IJ SOLN
0.2500 mg | INTRAMUSCULAR | Status: DC | PRN
  Administered 2024-04-29: 0.25 mg via INTRAVENOUS

## 2024-04-29 MED ORDER — ONDANSETRON HCL 4 MG/2ML IJ SOLN
INTRAMUSCULAR | Status: AC
  Filled 2024-04-29: qty 2

## 2024-04-29 MED ORDER — ORAL CARE MOUTH RINSE: 15.0000 mL | Freq: Once | OROMUCOSAL | Status: DC

## 2024-04-29 MED ORDER — LACTATED RINGERS IV SOLN: INTRAVENOUS | Status: DC

## 2024-04-29 MED ORDER — BUPIVACAINE LIPOSOME 1.3 % IJ SUSP
INTRAMUSCULAR | Status: AC
  Filled 2024-04-29: qty 20

## 2024-04-29 MED ORDER — SUGAMMADEX SODIUM 200 MG/2ML IV SOLN
INTRAVENOUS | Status: AC
  Filled 2024-04-29: qty 2

## 2024-04-29 MED ORDER — CHLORHEXIDINE GLUCONATE 0.12 % MT SOLN: 15.0000 mL | Freq: Once | OROMUCOSAL | Status: DC

## 2024-04-29 MED ORDER — PROPOFOL 1000 MG/100ML IV EMUL
INTRAVENOUS | Status: AC
  Filled 2024-04-29: qty 100

## 2024-04-29 MED ORDER — METRONIDAZOLE 500 MG/100ML IV SOLN
INTRAVENOUS | Status: AC
  Filled 2024-04-29: qty 100

## 2024-04-29 MED ORDER — MIDAZOLAM HCL 2 MG/2ML IJ SOLN
INTRAMUSCULAR | Status: AC
  Filled 2024-04-29: qty 2

## 2024-04-29 MED ORDER — BUPIVACAINE HCL (PF) 0.25 % IJ SOLN
INTRAMUSCULAR | Status: DC | PRN
  Administered 2024-04-29: 30 mL

## 2024-04-29 MED ORDER — DEXAMETHASONE SODIUM PHOSPHATE 10 MG/ML IJ SOLN
INTRAMUSCULAR | Status: AC
  Filled 2024-04-29: qty 1

## 2024-04-29 MED ORDER — CEFAZOLIN SODIUM-DEXTROSE 2-4 GM/100ML-% IV SOLN
INTRAVENOUS | Status: DC
Start: 2024-04-29 — End: 2024-04-29
  Filled 2024-04-29: qty 100

## 2024-04-29 MED ORDER — DROPERIDOL 2.5 MG/ML IJ SOLN: 0.6250 mg | Freq: Once | INTRAMUSCULAR | Status: DC | PRN

## 2024-04-29 MED ORDER — FENTANYL CITRATE (PF) 100 MCG/2ML IJ SOLN
INTRAMUSCULAR | Status: DC | PRN
  Administered 2024-04-29 (×4): 50 ug via INTRAVENOUS

## 2024-04-29 MED ORDER — POVIDONE-IODINE 10 % EX SWAB: 2.0000 | Freq: Once | CUTANEOUS | Status: DC

## 2024-04-29 MED ORDER — FENTANYL CITRATE (PF) 100 MCG/2ML IJ SOLN
INTRAMUSCULAR | Status: AC
  Filled 2024-04-29: qty 2

## 2024-04-29 MED ORDER — DEXAMETHASONE SODIUM PHOSPHATE 10 MG/ML IJ SOLN: 4.0000 mg | INTRAMUSCULAR | Status: DC

## 2024-04-29 MED ORDER — SUGAMMADEX SODIUM 200 MG/2ML IV SOLN
INTRAVENOUS | Status: DC | PRN
  Administered 2024-04-29: 200 mg via INTRAVENOUS

## 2024-04-29 MED ORDER — PROPOFOL 10 MG/ML IV BOLUS
INTRAVENOUS | Status: DC | PRN
  Administered 2024-04-29: 150 mg via INTRAVENOUS
  Administered 2024-04-29: 125 ug/kg/min via INTRAVENOUS

## 2024-04-29 MED ORDER — LIDOCAINE HCL (CARDIAC) PF 100 MG/5ML IV SOSY
PREFILLED_SYRINGE | INTRAVENOUS | Status: DC | PRN
  Administered 2024-04-29: 100 mg via INTRAVENOUS

## 2024-04-29 MED ORDER — DEXAMETHASONE SODIUM PHOSPHATE 10 MG/ML IJ SOLN
INTRAMUSCULAR | Status: DC | PRN
  Administered 2024-04-29: 5 mg via INTRAVENOUS

## 2024-04-29 MED ORDER — MIDAZOLAM HCL 5 MG/5ML IJ SOLN
INTRAMUSCULAR | Status: DC | PRN
  Administered 2024-04-29: 2 mg via INTRAVENOUS

## 2024-04-29 MED ORDER — LIDOCAINE HCL (PF) 2 % IJ SOLN
INTRAMUSCULAR | Status: AC
  Filled 2024-04-29: qty 5

## 2024-04-29 MED ORDER — HYDROMORPHONE HCL 1 MG/ML IJ SOLN
INTRAMUSCULAR | Status: DC | PRN
  Administered 2024-04-29: 1 mg via INTRAVENOUS

## 2024-04-29 MED ORDER — LIDOCAINE HCL (PF) 2 % IJ SOLN
INTRAMUSCULAR | Status: AC
  Filled 2024-04-29: qty 20

## 2024-04-29 MED ORDER — CEFAZOLIN SODIUM-DEXTROSE 1-4 GM/50ML-% IV SOLN
INTRAVENOUS | Status: DC | PRN
  Administered 2024-04-29: 2 g via INTRAVENOUS

## 2024-04-29 MED ORDER — PROPOFOL 10 MG/ML IV BOLUS
INTRAVENOUS | Status: AC
  Filled 2024-04-29: qty 20

## 2024-04-29 MED ORDER — BUPIVACAINE HCL (PF) 0.25 % IJ SOLN
INTRAMUSCULAR | Status: AC
  Filled 2024-04-29: qty 30

## 2024-04-29 SURGICAL SUPPLY — 68 items
APPLICATOR SURGIFLO ENDO (HEMOSTASIS) IMPLANT
BAG LAPAROSCOPIC 12 15 PORT 16 (BASKET) ×1 IMPLANT
BLADE SURG SZ10 CARB STEEL (BLADE) ×1 IMPLANT
COVER BACK TABLE 60X90IN (DRAPES) ×2 IMPLANT
COVER TIP SHEARS 8 DVNC (MISCELLANEOUS) ×2 IMPLANT
DERMABOND ADVANCED .7 DNX12 (GAUZE/BANDAGES/DRESSINGS) ×2 IMPLANT
DRAPE ARM DVNC X/XI (DISPOSABLE) ×8 IMPLANT
DRAPE COLUMN DVNC XI (DISPOSABLE) ×2 IMPLANT
DRAPE SHEET LG 3/4 BI-LAMINATE (DRAPES) ×2 IMPLANT
DRAPE SURG IRRIG POUCH 19X23 (DRAPES) ×2 IMPLANT
DRIVER NDL MEGA SUTCUT DVNCXI (INSTRUMENTS) ×1 IMPLANT
DRIVER NDLE MEGA SUTCUT DVNCXI (INSTRUMENTS) ×2 IMPLANT
DRSG OPSITE POSTOP 4X6 (GAUZE/BANDAGES/DRESSINGS) IMPLANT
DRSG OPSITE POSTOP 4X8 (GAUZE/BANDAGES/DRESSINGS) IMPLANT
ELECT PENCIL ROCKER SW 15FT (MISCELLANEOUS) ×1 IMPLANT
ELECT REM PT RETURN 15FT ADLT (MISCELLANEOUS) ×2 IMPLANT
FORCEPS BPLR FENES DVNC XI (FORCEP) ×2 IMPLANT
FORCEPS PROGRASP DVNC XI (FORCEP) ×2 IMPLANT
GAUZE 4X4 16PLY ~~LOC~~+RFID DBL (SPONGE) ×2 IMPLANT
GLOVE BIO SURGEON STRL SZ 6.5 (GLOVE) ×2 IMPLANT
GLOVE BIOGEL PI IND STRL 6.5 (GLOVE) ×4 IMPLANT
GLOVE BIOGEL PI MICRO STRL 6 (GLOVE) ×8 IMPLANT
GOWN STRL REUS W/ TWL LRG LVL3 (GOWN DISPOSABLE) ×8 IMPLANT
GRASPER SUT TROCAR 14GX15 (MISCELLANEOUS) IMPLANT
HOLDER FOLEY CATH W/STRAP (MISCELLANEOUS) IMPLANT
IRRIGATION SUCT STRKRFLW 2 WTP (MISCELLANEOUS) ×2 IMPLANT
KIT PROCEDURE DVNC SI (MISCELLANEOUS) IMPLANT
KIT TURNOVER KIT A (KITS) ×2 IMPLANT
LIGASURE IMPACT 36 18CM CVD LR (INSTRUMENTS) IMPLANT
MANIPULATOR ADVINCU DEL 3.0 PL (MISCELLANEOUS) IMPLANT
MANIPULATOR ADVINCU DEL 3.5 PL (MISCELLANEOUS) IMPLANT
MANIPULATOR UTERINE 4.5 ZUMI (MISCELLANEOUS) IMPLANT
NDL HYPO 21X1.5 SAFETY (NEEDLE) ×1 IMPLANT
NDL INSUFFLATION 14GA 120MM (NEEDLE) IMPLANT
NDL SPNL 20GX3.5 QUINCKE YW (NEEDLE) IMPLANT
NEEDLE HYPO 21X1.5 SAFETY (NEEDLE) ×2 IMPLANT
NEEDLE INSUFFLATION 14GA 120MM (NEEDLE) IMPLANT
NEEDLE SPNL 20GX3.5 QUINCKE YW (NEEDLE) ×2 IMPLANT
OBTURATOR OPTICALSTD 8 DVNC (TROCAR) ×2 IMPLANT
PACK ROBOT GYN CUSTOM WL (TRAY / TRAY PROCEDURE) ×2 IMPLANT
PAD ARMBOARD POSITIONER FOAM (MISCELLANEOUS) ×2 IMPLANT
PAD POSITIONING PINK XL (MISCELLANEOUS) ×2 IMPLANT
PORT ACCESS TROCAR AIRSEAL 12 (TROCAR) IMPLANT
SCISSORS LAP 5X45 EPIX DISP (ENDOMECHANICALS) IMPLANT
SCISSORS MNPLR CVD DVNC XI (INSTRUMENTS) ×2 IMPLANT
SCRUB CHG 4% DYNA-HEX 4OZ (MISCELLANEOUS) ×4 IMPLANT
SEAL UNIV 5-12 XI (MISCELLANEOUS) ×8 IMPLANT
SET TRI-LUMEN FLTR TB AIRSEAL (TUBING) ×2 IMPLANT
SPIKE FLUID TRANSFER (MISCELLANEOUS) ×2 IMPLANT
SPONGE T-LAP 18X18 ~~LOC~~+RFID (SPONGE) ×1 IMPLANT
SURGIFLO W/THROMBIN 8M KIT (HEMOSTASIS) IMPLANT
SUT MNCRL AB 4-0 PS2 18 (SUTURE) ×1 IMPLANT
SUT PDS AB 1 TP1 54 (SUTURE) ×2 IMPLANT
SUT VIC AB 0 CT1 27XBRD ANTBC (SUTURE) IMPLANT
SUT VIC AB 2-0 CT1 TAPERPNT 27 (SUTURE) ×1 IMPLANT
SUT VIC AB 3-0 SH 27XBRD (SUTURE) ×1 IMPLANT
SUT VIC AB 4-0 PS2 18 (SUTURE) ×4 IMPLANT
SUT VICRYL 0 27 CT2 27 ABS (SUTURE) ×1 IMPLANT
SYR 10ML LL (SYRINGE) IMPLANT
SYSTEM BAG RETRIEVAL 10MM (BASKET) ×1 IMPLANT
SYSTEM WOUND ALEXIS 18CM MED (MISCELLANEOUS) IMPLANT
TRAP SPECIMEN MUCUS 40CC (MISCELLANEOUS) ×1 IMPLANT
TRAY FOLEY MTR SLVR 16FR STAT (SET/KITS/TRAYS/PACK) ×2 IMPLANT
TROCAR PORT AIRSEAL 5X120 (TROCAR) ×1 IMPLANT
TROCAR XCEL NON-BLD 5MMX100MML (ENDOMECHANICALS) ×2 IMPLANT
UNDERPAD 30X36 HEAVY ABSORB (UNDERPADS AND DIAPERS) ×4 IMPLANT
WATER STERILE IRR 1000ML POUR (IV SOLUTION) ×1 IMPLANT
YANKAUER SUCT BULB TIP 10FT TU (MISCELLANEOUS) ×1 IMPLANT

## 2024-04-29 NOTE — Transfer of Care (Signed)
 Immediate Anesthesia Transfer of Care Note  Patient: Amy Ferguson  Procedure(s) Performed: ROBOTIC-ASSISTED LAPAROSCOPIC LEFT SALPINGO-OOPHORECTOMY, RIGHT SALPINGECTOMY, MINI LAPAROTOMY FOR CONTROLLED CYST DRAINAGE, PERITONEAL AND OMENTAL BIOPSIES (Bilateral)  Patient Location: PACU  Anesthesia Type:General  Level of Consciousness: drowsy  Airway & Oxygen Therapy: Patient Spontanous Breathing and Patient connected to face mask oxygen  Post-op Assessment: Report given to RN and Post -op Vital signs reviewed and stable  Post vital signs: Reviewed and stable  Last Vitals:  Vitals Value Taken Time  BP 134/104 04/29/24 16:45  Temp    Pulse 83 04/29/24 16:45  Resp 18 04/29/24 16:45  SpO2 100 % 04/29/24 16:45  Vitals shown include unfiled device data.  Last Pain:  Vitals:   04/29/24 1154  TempSrc:   PainSc: 0-No pain         Complications: No notable events documented.

## 2024-04-29 NOTE — Interval H&P Note (Signed)
 History and Physical Interval Note:  04/29/2024 1:00 PM  Amy Ferguson  has presented today for surgery, with the diagnosis of PELVIC MASS, ELEVATED CA-125.  The various methods of treatment have been discussed with the patient and family. After consideration of risks, benefits and other options for treatment, the patient has consented to  Procedure(s) with comments: LAPAROTOMY (N/A) - MINI LAPAROTOMY FOR CONTROLLED CYST DRAINAGE SALPINGO-OOPHORECTOMY, ROBOT-ASSISTED (N/A) - UNILATERAL TBD, POSSIBLE STAGING HYSTERECTOMY, TOTAL, ROBOT-ASSISTED, LAPAROSCOPIC, WITH BILATERAL SALPINGO-OOPHORECTOMY (N/A) as a surgical intervention.    Will plan for contralateral salpingectomy if this is benign or borderline and does not require contralateral oophorectomy or hysterectomy.  The patient's history has been reviewed, patient examined, no change in status, stable for surgery.  I have reviewed the patient's chart and labs.  Questions were answered to the patient's satisfaction.     [name]

## 2024-04-29 NOTE — Anesthesia Procedure Notes (Signed)
 Procedure Name: Intubation Date/Time: 04/29/2024 1:19 PM  Performed by: Belvie Valri NOVAK, CRNAPre-anesthesia Checklist: Patient identified, Emergency Drugs available, Suction available and Patient being monitored Patient Re-evaluated:Patient Re-evaluated prior to induction Oxygen Delivery Method: Circle System Utilized Preoxygenation: Pre-oxygenation with 100% oxygen Induction Type: IV induction Ventilation: Mask ventilation without difficulty Laryngoscope Size: Mac and 3 Grade View: Grade I Tube type: Oral Tube size: 7.0 mm Number of attempts: 1 Airway Equipment and Method: Stylet and Oral airway Placement Confirmation: ETT inserted through vocal cords under direct vision, positive ETCO2 and breath sounds checked- equal and bilateral Secured at: 22 cm Tube secured with: Tape Dental Injury: Teeth and Oropharynx as per pre-operative assessment

## 2024-04-29 NOTE — Anesthesia Postprocedure Evaluation (Signed)
 Anesthesia Post Note  Patient: Amy Ferguson  Procedure(s) Performed: ROBOTIC-ASSISTED LAPAROSCOPIC LEFT SALPINGO-OOPHORECTOMY, RIGHT SALPINGECTOMY, MINI LAPAROTOMY FOR CONTROLLED CYST DRAINAGE, PERITONEAL AND OMENTAL BIOPSIES (Bilateral)     Patient location during evaluation: PACU Anesthesia Type: General Level of consciousness: awake and alert Pain management: pain level controlled Vital Signs Assessment: post-procedure vital signs reviewed and stable Respiratory status: spontaneous breathing, nonlabored ventilation and respiratory function stable Cardiovascular status: blood pressure returned to baseline Postop Assessment: no apparent nausea or vomiting Anesthetic complications: no   No notable events documented.  Last Vitals:  Vitals:   04/29/24 1715 04/29/24 1730  BP: 138/86 133/86  Pulse: 83 78  Resp: 12 14  Temp:    SpO2: 94% 90%    Last Pain:  Vitals:   04/29/24 1730  TempSrc:   PainSc: 3                  Vertell Row

## 2024-04-29 NOTE — Discharge Instructions (Addendum)
 AFTER SURGERY INSTRUCTIONS   Return to work: 4-6 weeks if applicable   Activity: 1. Be up and out of the bed during the day.  Take a nap if needed.  You may walk up steps but be careful and use the hand rail.  Stair climbing will tire you more than you think, you may need to stop part way and rest.    2. No lifting or straining for 6 weeks over 10 pounds. No pushing, pulling, straining for 6 weeks.   3. No driving for 4-89 days when the following criteria have been met: Do not drive if you are taking narcotic pain medicine and make sure that your reaction time has returned.    4. You can shower as soon as the next day after surgery. Shower daily.  Use your regular soap and water  (not directly on the incision) and pat your incision(s) dry afterwards; don't rub.  No tub baths or submerging your body in water  until cleared by your surgeon. If you have the soap that was given to you by pre-surgical testing that was used before surgery, you do not need to use it afterwards because this can irritate your incisions.    5. No sexual activity and nothing in the vagina for 6 weeks, 12 weeks if you have a hysterectomy (removal of the uterus and cervix).   6. You may experience a small amount of clear drainage from your incisions, which is normal.  If the drainage persists, increases, or changes color please call the office.   7. Do not use creams, lotions, or ointments such as neosporin on your incisions after surgery until advised by your surgeon because they can cause removal of the dermabond glue on your incisions.     8. You may experience vaginal spotting after surgery or when the stitches at the top of the vagina begin to dissolve (if you have a hysterectomy).  The spotting is normal but if you experience heavy bleeding, call our office.   9. Take Tylenol  first for pain if you are able to take these medication and only use narcotic pain medication for severe pain not relieved by the Tylenol .  Monitor  your Tylenol  intake to a max of 4,000 mg in a 24 hour period.   Diet: 1. Low sodium Heart Healthy Diet is recommended but you are cleared to resume your normal (before surgery) diet after your procedure.   2. Keep bowel movements regular and prevent constipation.     Wound Care: 1. Keep clean and dry.  Shower daily.   Reasons to call the Doctor: Fever - Oral temperature greater than 100.4 degrees Fahrenheit Foul-smelling vaginal discharge Difficulty urinating Nausea and vomiting Increased pain at the site of the incision that is unrelieved with pain medicine. Difficulty breathing with or without chest pain New calf pain especially if only on one side Sudden, continuing increased vaginal bleeding with or without clots.   Contacts: For questions or concerns you should contact:   Dr. Hoy Masters at [phone]   Eleanor Epps, NP at [phone]   After Hours: call [phone] and have the GYN Oncologist paged/contacted (after 5 pm or on the weekends). You will speak with an after hours RN and let he or she know you have had surgery.   Messages sent via mychart are for non-urgent matters and are not responded to after hours so for urgent needs, please call the after hours number.

## 2024-04-29 NOTE — Op Note (Signed)
 GYNECOLOGIC ONCOLOGY OPERATIVE NOTE  Date of Service: 04/29/2024  Preoperative Diagnosis: Pelvic mass, elevated CA125  Postoperative Diagnosis: Left ovarian mass, at least borderline tumor  Procedures: Mini laparotomy for controlled cyst drainage, robotic assisted left salpingo-oophorectomy, right salpingectomy, peritoneal and omental biopsies  Surgeon: Hoy Masters, MD  Assistants: Olam Mill, MD and (an MD assistant was necessary for tissue manipulation, management of robotic instrumentation, retraction and positioning due to the complexity of the case and hospital policies)  Anesthesia: General  Estimated Blood Loss: 20 mL    Fluids: 1000 ml, crystalloid  Urine Output: 150 ml, clear yellow  Findings: On mini laparotomy, smooth surface of pelvic mass.  Murky fluid drained from cyst.  On entry to abdomen, normal upper abdominal survey with smooth diaphragm, liver edge, stomach, normal appearing omentum and bowel.  No adhesions from recent surgery.  The pelvis, cystic and solid mass arising from the left ovary.  Normal-appearing right ovary.  Mildly enlarged multifibroid uterus.  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    Specimens:  ID Type Source Tests Collected by Time Destination  1 : Left Tube and Ovary Tissue PATH Gyn tumor resection SURGICAL PATHOLOGY Masters Hoy, MD 04/29/2024 1403   2 : Right  Tube Tissue PATH Gyn tumor resection SURGICAL PATHOLOGY Masters Hoy, MD 04/29/2024 1433   3 : ANTERIOR CULDESAC PERITONEAL BIOPSY Tissue PATH Gyn biopsy SURGICAL PATHOLOGY Masters Hoy, MD 04/29/2024 1526   4 : RIGHT PELVIC  PERITONEAL BIOPSY Tissue PATH Gyn biopsy SURGICAL PATHOLOGY Masters Hoy, MD 04/29/2024 1535   5 : LEFT PELVIC  PERITONEAL BIOPSY Tissue PATH Gyn biopsy SURGICAL PATHOLOGY Masters Hoy, MD 04/29/2024 1537   6 : RIGHT PARACOLIC GUTTER PERITONEAL BIOPSY Tissue PATH Gyn biopsy SURGICAL PATHOLOGY Masters Hoy, MD 04/29/2024 1540   7 : LEFT  PARACOLIC GUTTER PERITONEAL BIOPSY Tissue PATH Gyn biopsy SURGICAL PATHOLOGY Masters Hoy, MD 04/29/2024 1542   8 : OMENTAL  BIOPSY Tissue PATH Gyn biopsy SURGICAL PATHOLOGY Masters Hoy, MD 04/29/2024 1550   9 : POSTERIOR CULDESAC PERITONEAL BIOPSY Tissue PATH Gyn biopsy SURGICAL PATHOLOGY Masters Hoy, MD 04/29/2024 1551   A : Pelvic Washing Body Fluid PATH Cytology Pelvic Washing CYTOLOGY - NON PAP Masters Hoy, MD 04/29/2024 1401     Complications:  None  Indications for Procedure: Amy Ferguson is a 43 y.o. woman with a mass noted in the pelvis at time of bariatric surgery. CA125 drawn at that time and elevated. On later review of chart, mass noted to be present since obstetric ultrasound in 2016.  Prior to the procedure, all risks, benefits, and alternatives were discussed and informed surgical consent was signed.  Procedure: Patient was taken to the operating room where general anesthesia was achieved.  She was positioned in dorsal lithotomy and prepped and draped.  A foley catheter was inserted into the bladder.  A hulka manipulator was secured in the cervix.   An approximate 4cm vertical midline incision was made, superior to the umbilicus, with the scalpel. The subcutaneous tissue was divided with electrocautery. The abdomen was entered sharply. An alexis retractor was placed. Washings were obtained. A pursestring stitch of 3-0 vicryl was placed in the cyst wall, and using a gallbladder trocar, the cyst was drained in a controlled fashion. The gallbladder trocar was removed and the pursestring stitch tied down. The cap was then placed on the Alexis retractor and a robotic trocar placed through the cap. The abdomen was insufflated. A camera was introduced and the abdomen surveyed with the  findings as noted above.   The patient was placed in steep Trendelenburg, and additional trocars were placed as follows: one 8 mm robotic trocar in the right abdomen, one 8 mm robotic trocar  in the left abdomen and one 5mm airseal trocar in the left upper quadrant, medial to the robotic trocar.   All trocars were placed under direct visualization.  The bowels were moved into the upper abdomen.  The DaVinci robotic surgical system was brought to the patient's bedside and docked.  The left pelvic sidewall peritoneum was incised and the retroperitoneum entered.  The left ureter was identified.  The left infundibulopelvic ligament was isolated, cauterized, and transected.  The broad ligament was incised to the uterine cornu.  The utero-ovarian ligament and the proximal fallopian tube were isolated, cauterized, and transected. Nex the mesosalpinx underlying the right fallopian tube was serially cauterized and transected. The proximal fallopian tube was cauterized and transected, and the right fallopian tube was removed from the assistant trocar.   The left adnexal mass specimen was then placed into an Endo-Catch bag. The robotic instruments were removed. The alexis cap was removed, and the endocatch bag was brought up through the midline incision. The mass was gently morcellated within the bag, out of the abdomen. The mass was removed. The bag was inspected and noted to be intact. The left adnexal mass was sent for frozen pathology and return with at least borderline tumor. Given prior discussion on how to proceed in the context of a borderline tumor, the cap was replaced and the abdomen re-insufflated to proceed with additional biopsies. Robotic instruments were replaced.  Using a combination of electrocautery and blunt dissection with the scissors, several peritoneal biopsies were obtained throughout the abdomen and the pelvis. The distal omentum was then grasped. An omental biopsy was obtained by serially cauterizing and transected across the inferior portion of the omentum.   The pelvis was irrigated and all operative sites were found to be hemostatic.  All instruments were removed and the robot  was taken from the patient's bedside. The alexis retractor and cap were removed, and the abdomen was desufflated. The fascia was closed with a running stitch of #1 PDS. Exparel  was injected at the incision site in standard fashion. The subcutaneous tissues were irrigated and hemostasis achieved. The subcutaneous space was approximated with 2-0 Vicryl in a running fashion. The skin was closed with a deep dermal stitch of 4-0 vicryl, 4-0 monocryl in a subcuticular fashion followed by surgical glue.   All ports were removed. The skin at all laparoscopic incisions was closed with 4-0 Vicryl to reapproximate the subcutaneous tissue and 4-0 monocryl in a subcuticular fashion followed by surgical glue.  Patient tolerated the procedure well. Sponge, lap, and instrument counts were correct. 2 gm of Ancef  was administered prior to skin incision for routine perioperative antibiotics.  She was extubated and taken to the PACU in stable condition.  Hoy Masters, MD Gynecologic Oncology

## 2024-04-30 ENCOUNTER — Encounter (HOSPITAL_COMMUNITY): Payer: Self-pay | Admitting: Psychiatry

## 2024-04-30 ENCOUNTER — Telehealth: Payer: Self-pay | Admitting: *Deleted

## 2024-04-30 NOTE — Addendum Note (Signed)
 Addendum  created 04/30/24 0527 by Jefm Garnette LABOR, MD   Attestation recorded in Intraprocedure, Intraprocedure Attestations filed

## 2024-04-30 NOTE — Telephone Encounter (Signed)
 Spoke with Ms. Sgro this morning. She states she doesn't have her appetite back yet but is drinking well. States she is experiencing some burning on urination but it seems to be getting better. Advised patient this can be common from the foley catheter placed during surgery that can cause urethral irritation and this will resolve.  She has not had a BM and is not passing gas, but is not experiencing any gas pain or bloating. She is taking senokot as prescribed and encouraged her to drink plenty of water . She denies fever or chills. Incisions are dry and intact. She rates her pain 6/10 while sitting and 8/10 while walking.  Her pain is controlled with tramadol . Encouraged ambulation, and the use of alternating heating pad and ice packs to abdomen. Advised patient the office will call her tomorrow to check in. Pt verbalized understanding.     Instructed to call office with any fever, chills, purulent drainage, uncontrolled pain or any other questions or concerns. Patient verbalizes understanding.   Pt aware of post op appointments as well as the office number [phone] and after hours number [phone] to call if she has any questions or concerns

## 2024-05-01 ENCOUNTER — Other Ambulatory Visit: Payer: Self-pay | Admitting: Gynecologic Oncology

## 2024-05-01 ENCOUNTER — Telehealth: Payer: Self-pay

## 2024-05-01 DIAGNOSIS — G8918 Other acute postprocedural pain: Secondary | ICD-10-CM

## 2024-05-01 MED ORDER — OXYCODONE HCL 5 MG PO TABS: 5.0000 mg | ORAL_TABLET | ORAL | 0 refills | Status: AC | PRN

## 2024-05-01 NOTE — Progress Notes (Signed)
See note from RN

## 2024-05-01 NOTE — Telephone Encounter (Signed)
 Spoke with patient in regards to her increased abd. Incision pain that is not relieved with tramadol . Pt agrees to try a different pain medicine. Advised that provider will send in a new Rx to her pharmacy.    Also advised patient to monitor the vaginal bleeding and call the office if the bleeding increases, worsens and she is saturating a pad and changing frequently. Pt states she still has menstrual cycles and her period is due soon. This maybe a start of a cycle. Pt verbalized understanding and thanked the office for calling back.

## 2024-05-01 NOTE — Telephone Encounter (Signed)
 FMLA forms received. Requested information provided and emailed to pt at [email or web address]. Pt is aware

## 2024-05-01 NOTE — Telephone Encounter (Signed)
 Spoke with Ms. Amy Ferguson this morning. Pt is still complaining of abdominal pain rating 8/10. Pt reports the pain is not sharp, but it appears to be all over but mainly in her incisions. She describes the pain like I have been hit in the stomach repeatedly.  Patient is ambulating, and states the pain is worse when she stands up, coughs and moves. Pt reports still not passing gas. And has not had a bowel movement. She is taking her senokot-s 2 each evening with plenty of water .  She is sleeping in a recliner because getting in and out of her bed is too much.   Patient reports supporting her abdomen with a pillow, alternating heating pad and ice. She is taking Tramadol  every 6 hours and after the 3 hours she is taking extra strength tylenol .   Patient also reports having some vaginal bleeding little more than spotting, is wearing a peri pad and they are not saturated but when she goes to the bathroom she is passing quarter size clots.   Advised patient to continue with hot liquids like mint herbal tea and add gas-x chews and add miralax today, 1 capful twice daily. Also advised to help support the abdominal incisions to wear high waist  yoga pants or abdominal binder. Advised her message will be relayed to providers and the office will call back. Pt verbalized understanding and thanked the office for calling.

## 2024-05-02 ENCOUNTER — Telehealth: Payer: Self-pay

## 2024-05-02 LAB — CYTOLOGY - NON PAP

## 2024-05-02 LAB — SURGICAL PATHOLOGY

## 2024-05-02 NOTE — Telephone Encounter (Signed)
 Spoke with patient who states she is on her way to pick up Rx for oxycodone , her mother is driving her. Pt reports that there was a problem with her insurance company? And they didn't have it ready yesterday. Pt reports that she is now passing gas. Her appetite has improved. Hasn't had any tramadol  since 8:30 pm last night. States her pain is starting to improve and it's 7/10. Pt is staying on her bowel regime, and hasn't had a BM yet. Reminded patient to follow all activity restrictions and especially no straining. Pt is aware to call the office if her symptoms don't continue to improve.  Pt thanked the office for calling.

## 2024-05-02 NOTE — Telephone Encounter (Signed)
 The office received an email from Cover[employer] regarding a PA for Oxycodone  5mg  prescription sent in by Eleanor Epps NP.   Requested information provided and sent to insurance plan

## 2024-05-02 NOTE — Telephone Encounter (Signed)
 Notice of approval from [facility] pharmacy on Oxycodone  5mg  received.   Pt is aware and states she had already picked it up from the pharmacy.

## 2024-05-03 LAB — TYPE AND SCREEN
ABO/RH(D): A NEG
Antibody Screen: POSITIVE
Unit division: 0
Unit division: 0

## 2024-05-03 LAB — BPAM RBC
Blood Product Expiration Date: 202509272359
Blood Product Expiration Date: 202509272359
Unit Type and Rh: 600
Unit Type and Rh: 600

## 2024-05-06 ENCOUNTER — Ambulatory Visit: Payer: Self-pay | Admitting: Psychiatry

## 2024-05-07 LAB — TYPE AND SCREEN
ABO/RH(D): A NEG
Antibody Screen: POSITIVE

## 2024-05-19 ENCOUNTER — Inpatient Hospital Stay: Attending: Psychiatry | Admitting: Psychiatry

## 2024-05-19 ENCOUNTER — Encounter: Payer: Self-pay | Admitting: Psychiatry

## 2024-05-19 VITALS — BP 140/82 | HR 67 | Temp 98.8°F | Resp 20 | Wt 212.0 lb

## 2024-05-19 DIAGNOSIS — C562 Malignant neoplasm of left ovary: Secondary | ICD-10-CM

## 2024-05-19 DIAGNOSIS — Z9079 Acquired absence of other genital organ(s): Secondary | ICD-10-CM | POA: Diagnosis not present

## 2024-05-19 DIAGNOSIS — Z7189 Other specified counseling: Secondary | ICD-10-CM

## 2024-05-19 DIAGNOSIS — D3912 Neoplasm of uncertain behavior of left ovary: Secondary | ICD-10-CM | POA: Diagnosis not present

## 2024-05-19 DIAGNOSIS — Z90721 Acquired absence of ovaries, unilateral: Secondary | ICD-10-CM | POA: Insufficient documentation

## 2024-05-19 DIAGNOSIS — R1900 Intra-abdominal and pelvic swelling, mass and lump, unspecified site: Secondary | ICD-10-CM
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
## 2024-05-19 NOTE — Patient Instructions (Signed)
 It was a pleasure to see you in clinic today. - healing well from surgery - Plan ca125 and pelvic ultrasound along with pelvic exam every 6 months for the first 2 years - Return visit planned for 6 months  Thank you very much for allowing me to provide care for you today.  I appreciate your confidence in choosing our Gynecologic Oncology team at [facility].  If you have any questions about your visit today please call our office or send us  a MyChart message and we will get back to you as soon as possible.

## 2024-05-20 ENCOUNTER — Telehealth: Payer: Self-pay

## 2024-05-20 NOTE — Telephone Encounter (Signed)
 Fitness for duty certification form received from [employer], via email from patient.  Requested information provided and faxed to number given on form.

## 2024-07-15 ENCOUNTER — Ambulatory Visit: Admitting: Pulmonary Disease

## 2024-07-15 ENCOUNTER — Ambulatory Visit (INDEPENDENT_AMBULATORY_CARE_PROVIDER_SITE_OTHER): Admitting: *Deleted

## 2024-07-15 ENCOUNTER — Encounter: Payer: Self-pay | Admitting: Pulmonary Disease

## 2024-07-15 VITALS — BP 146/92 | HR 74 | Temp 98.4°F | Ht 65.0 in | Wt 200.0 lb

## 2024-07-15 DIAGNOSIS — J4520 Mild intermittent asthma, uncomplicated: Secondary | ICD-10-CM | POA: Diagnosis not present

## 2024-07-15 LAB — PULMONARY FUNCTION TEST
DL/VA % pred: 116 %
DL/VA: 5.1 ml/min/mmHg/L
DLCO cor % pred: 98 %
DLCO cor: 22.03 ml/min/mmHg
DLCO unc % pred: 98 %
DLCO unc: 22.03 ml/min/mmHg
FEF 25-75 Post: 2.98 L/s
FEF 25-75 Pre: 2.05 L/s
FEF2575-%Change-Post: 45 %
FEF2575-%Pred-Post: 95 %
FEF2575-%Pred-Pre: 65 %
FEV1-%Change-Post: 9 %
FEV1-%Pred-Post: 80 %
FEV1-%Pred-Pre: 73 %
FEV1-Post: 2.48 L
FEV1-Pre: 2.26 L
FEV1FVC-%Change-Post: 5 %
FEV1FVC-%Pred-Pre: 97 %
FEV6-%Change-Post: 3 %
FEV6-%Pred-Post: 78 %
FEV6-%Pred-Pre: 75 %
FEV6-Post: 2.93 L
FEV6-Pre: 2.82 L
FEV6FVC-%Pred-Post: 102 %
FEV6FVC-%Pred-Pre: 102 %
FVC-%Change-Post: 3 %
FVC-%Pred-Post: 77 %
FVC-%Pred-Pre: 74 %
FVC-Post: 2.93 L
FVC-Pre: 2.82 L
Post FEV1/FVC ratio: 85 %
Post FEV6/FVC ratio: 100 %
Pre FEV1/FVC ratio: 80 %
Pre FEV6/FVC Ratio: 100 %
RV % pred: 110 %
RV: 1.85 L
TLC % pred: 94 %
TLC: 4.92 L
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
## 2024-07-15 NOTE — Patient Instructions (Signed)
  VISIT SUMMARY: You had a follow-up visit for your asthma. Your asthma has been well-controlled since May or June 2025, and you have not needed to use your rescue inhaler recently. Your lung function tests are normal, and no controller medication is needed at this time.  YOUR PLAN: MILD INTERMITTENT ASTHMA: Your asthma symptoms have improved since April or May, and you have not needed to use your rescue inhaler recently. Your lung function tests are normal with slight improvements after using albuterol . -Continue using Arsupra as needed for asthma symptoms. -Schedule a follow-up appointment in one year or sooner if your symptoms worsen.

## 2024-07-15 NOTE — Patient Instructions (Signed)
 Full PFT performed today.

## 2024-07-15 NOTE — Progress Notes (Signed)
 Full PFT performed today.

## 2024-11-05 ENCOUNTER — Other Ambulatory Visit (HOSPITAL_COMMUNITY)

## 2024-11-17 ENCOUNTER — Other Ambulatory Visit

## 2024-11-17 ENCOUNTER — Ambulatory Visit: Admitting: Psychiatry
